# Patient Record
Sex: Female | Born: 1950 | Race: Black or African American | Hispanic: No | Marital: Single | Smoking: Never smoker
Health system: Southern US, Community
[De-identification: ages and names within clinical notes are randomized; demographics above are authoritative.]

## PROBLEM LIST (undated history)

## (undated) DIAGNOSIS — F259 Schizoaffective disorder, unspecified: Secondary | ICD-10-CM

## (undated) DIAGNOSIS — E785 Hyperlipidemia, unspecified: Secondary | ICD-10-CM

## (undated) DIAGNOSIS — E039 Hypothyroidism, unspecified: Secondary | ICD-10-CM

## (undated) DIAGNOSIS — Z720 Tobacco use: Secondary | ICD-10-CM

## (undated) HISTORY — DX: Hyperlipidemia, unspecified: E78.5

---

## 1997-07-22 ENCOUNTER — Ambulatory Visit (HOSPITAL_COMMUNITY): Admission: RE | Admit: 1997-07-22 | Discharge: 1997-07-22 | Payer: Self-pay | Admitting: Endocrinology

## 1997-08-30 ENCOUNTER — Ambulatory Visit (HOSPITAL_COMMUNITY): Admission: RE | Admit: 1997-08-30 | Discharge: 1997-08-30 | Payer: Self-pay | Admitting: Endocrinology

## 1997-10-04 ENCOUNTER — Ambulatory Visit (HOSPITAL_COMMUNITY): Admission: RE | Admit: 1997-10-04 | Discharge: 1997-10-04 | Payer: Self-pay | Admitting: Family Medicine

## 1997-10-18 ENCOUNTER — Ambulatory Visit (HOSPITAL_COMMUNITY): Admission: RE | Admit: 1997-10-18 | Discharge: 1997-10-18 | Payer: Self-pay | Admitting: Family Medicine

## 1997-10-21 ENCOUNTER — Other Ambulatory Visit: Admission: RE | Admit: 1997-10-21 | Discharge: 1997-10-21 | Payer: Self-pay | Admitting: Family Medicine

## 1997-11-20 ENCOUNTER — Emergency Department (HOSPITAL_COMMUNITY): Admission: EM | Admit: 1997-11-20 | Discharge: 1997-11-20 | Payer: Self-pay | Admitting: Emergency Medicine

## 1997-11-21 ENCOUNTER — Inpatient Hospital Stay (HOSPITAL_COMMUNITY): Admission: RE | Admit: 1997-11-21 | Discharge: 1997-12-02 | Payer: Self-pay | Admitting: *Deleted

## 1998-05-13 ENCOUNTER — Ambulatory Visit (HOSPITAL_COMMUNITY): Admission: RE | Admit: 1998-05-13 | Discharge: 1998-05-13 | Payer: Self-pay | Admitting: Family Medicine

## 1998-10-03 ENCOUNTER — Encounter: Payer: Self-pay | Admitting: Emergency Medicine

## 1998-10-03 ENCOUNTER — Emergency Department (HOSPITAL_COMMUNITY): Admission: EM | Admit: 1998-10-03 | Discharge: 1998-10-03 | Payer: Self-pay | Admitting: Emergency Medicine

## 1999-05-13 ENCOUNTER — Encounter: Payer: Self-pay | Admitting: Emergency Medicine

## 1999-05-13 ENCOUNTER — Ambulatory Visit (HOSPITAL_COMMUNITY): Admission: RE | Admit: 1999-05-13 | Discharge: 1999-05-13 | Payer: Self-pay | Admitting: Emergency Medicine

## 2000-05-16 ENCOUNTER — Ambulatory Visit (HOSPITAL_COMMUNITY): Admission: RE | Admit: 2000-05-16 | Discharge: 2000-05-16 | Payer: Self-pay | Admitting: Family Medicine

## 2000-05-16 ENCOUNTER — Encounter: Payer: Self-pay | Admitting: Family Medicine

## 2000-12-23 ENCOUNTER — Inpatient Hospital Stay (HOSPITAL_COMMUNITY): Admission: EM | Admit: 2000-12-23 | Discharge: 2001-01-11 | Payer: Self-pay | Admitting: Psychiatry

## 2001-05-18 ENCOUNTER — Ambulatory Visit (HOSPITAL_COMMUNITY): Admission: RE | Admit: 2001-05-18 | Discharge: 2001-05-18 | Payer: Self-pay | Admitting: Family Medicine

## 2001-05-18 ENCOUNTER — Encounter: Payer: Self-pay | Admitting: Family Medicine

## 2001-06-15 ENCOUNTER — Inpatient Hospital Stay (HOSPITAL_COMMUNITY): Admission: EM | Admit: 2001-06-15 | Discharge: 2001-07-07 | Payer: Self-pay | Admitting: *Deleted

## 2002-02-27 ENCOUNTER — Inpatient Hospital Stay (HOSPITAL_COMMUNITY): Admission: EM | Admit: 2002-02-27 | Discharge: 2002-03-09 | Payer: Self-pay | Admitting: Psychiatry

## 2002-03-08 ENCOUNTER — Emergency Department (HOSPITAL_COMMUNITY): Admission: EM | Admit: 2002-03-08 | Discharge: 2002-03-08 | Payer: Self-pay | Admitting: Emergency Medicine

## 2002-05-21 ENCOUNTER — Ambulatory Visit (HOSPITAL_COMMUNITY): Admission: RE | Admit: 2002-05-21 | Discharge: 2002-05-21 | Payer: Self-pay | Admitting: Family Medicine

## 2002-05-21 ENCOUNTER — Encounter: Payer: Self-pay | Admitting: Family Medicine

## 2003-05-23 ENCOUNTER — Ambulatory Visit (HOSPITAL_COMMUNITY): Admission: RE | Admit: 2003-05-23 | Discharge: 2003-05-23 | Payer: Self-pay | Admitting: Family Medicine

## 2004-05-25 ENCOUNTER — Ambulatory Visit (HOSPITAL_COMMUNITY): Admission: RE | Admit: 2004-05-25 | Discharge: 2004-05-25 | Payer: Self-pay | Admitting: Family Medicine

## 2005-05-27 ENCOUNTER — Encounter: Admission: RE | Admit: 2005-05-27 | Discharge: 2005-05-27 | Payer: Self-pay | Admitting: Family Medicine

## 2006-02-11 ENCOUNTER — Inpatient Hospital Stay (HOSPITAL_COMMUNITY): Admission: EM | Admit: 2006-02-11 | Discharge: 2006-02-13 | Payer: Self-pay | Admitting: Emergency Medicine

## 2006-02-13 ENCOUNTER — Ambulatory Visit: Payer: Self-pay | Admitting: *Deleted

## 2006-02-13 ENCOUNTER — Inpatient Hospital Stay (HOSPITAL_COMMUNITY): Admission: EM | Admit: 2006-02-13 | Discharge: 2006-02-21 | Payer: Self-pay | Admitting: *Deleted

## 2006-06-17 ENCOUNTER — Ambulatory Visit (HOSPITAL_COMMUNITY): Admission: RE | Admit: 2006-06-17 | Discharge: 2006-06-17 | Payer: Self-pay | Admitting: Family Medicine

## 2006-08-29 ENCOUNTER — Encounter: Admission: RE | Admit: 2006-08-29 | Discharge: 2006-08-29 | Payer: Self-pay | Admitting: Family Medicine

## 2006-11-24 ENCOUNTER — Encounter: Admission: RE | Admit: 2006-11-24 | Discharge: 2006-11-24 | Payer: Self-pay | Admitting: Family Medicine

## 2007-07-26 ENCOUNTER — Ambulatory Visit (HOSPITAL_COMMUNITY): Admission: RE | Admit: 2007-07-26 | Discharge: 2007-07-26 | Payer: Self-pay | Admitting: Family Medicine

## 2008-07-26 ENCOUNTER — Ambulatory Visit (HOSPITAL_COMMUNITY): Admission: RE | Admit: 2008-07-26 | Discharge: 2008-07-26 | Payer: Self-pay | Admitting: Family Medicine

## 2009-01-24 ENCOUNTER — Encounter: Admission: RE | Admit: 2009-01-24 | Discharge: 2009-01-24 | Payer: Self-pay | Admitting: Family Medicine

## 2009-04-24 ENCOUNTER — Observation Stay (HOSPITAL_COMMUNITY): Admission: AD | Admit: 2009-04-24 | Discharge: 2009-04-25 | Payer: Self-pay | Admitting: Internal Medicine

## 2009-04-24 ENCOUNTER — Other Ambulatory Visit: Admission: RE | Admit: 2009-04-24 | Discharge: 2009-04-24 | Payer: Self-pay | Admitting: Family Medicine

## 2009-04-24 ENCOUNTER — Encounter (INDEPENDENT_AMBULATORY_CARE_PROVIDER_SITE_OTHER): Payer: Self-pay | Admitting: Internal Medicine

## 2009-07-28 ENCOUNTER — Ambulatory Visit (HOSPITAL_COMMUNITY): Admission: RE | Admit: 2009-07-28 | Discharge: 2009-07-28 | Payer: Self-pay | Admitting: Family Medicine

## 2010-05-03 LAB — URINALYSIS, MICROSCOPIC ONLY
Glucose, UA: NEGATIVE mg/dL
Specific Gravity, Urine: 1.006 (ref 1.005–1.030)
Urobilinogen, UA: 1 mg/dL (ref 0.0–1.0)
pH: 5.5 (ref 5.0–8.0)

## 2010-05-03 LAB — COMPREHENSIVE METABOLIC PANEL
ALT: 16 U/L (ref 0–35)
Alkaline Phosphatase: 88 U/L (ref 39–117)
BUN: 5 mg/dL — ABNORMAL LOW (ref 6–23)
Calcium: 9.1 mg/dL (ref 8.4–10.5)
Chloride: 104 mEq/L (ref 96–112)
Creatinine, Ser: 0.95 mg/dL (ref 0.4–1.2)
GFR calc Af Amer: >60 mL/min (ref >60)
Glucose, Bld: 114 mg/dL — ABNORMAL HIGH (ref 70–99)
Potassium: 3.9 mEq/L (ref 3.5–5.1)
Sodium: 143 mEq/L (ref 135–145)
Total Bilirubin: 0.4 mg/dL (ref 0.3–1.2)

## 2010-05-03 LAB — CBC
Hemoglobin: 14.5 g/dL (ref 12.0–15.0)
MCHC: 32.9 g/dL (ref 30.0–36.0)
RBC: 5.14 MIL/uL — ABNORMAL HIGH (ref 3.87–5.11)
WBC: 10.5 10*3/uL (ref 4.0–10.5)

## 2010-05-03 LAB — SEDIMENTATION RATE: Sed Rate: 9 mm/hr (ref 0–22)

## 2010-05-03 LAB — URINE CULTURE
Colony Count: NO GROWTH
Culture: NO GROWTH

## 2010-05-03 LAB — DIFFERENTIAL
Basophils Absolute: 0 10*3/uL (ref 0.0–0.1)
Basophils Relative: 0 % (ref 0–1)
Eosinophils Absolute: 0.1 10*3/uL (ref 0.0–0.7)
Lymphs Abs: 3.5 10*3/uL (ref 0.7–4.0)

## 2010-05-03 LAB — PREALBUMIN: Prealbumin: 17.7 mg/dL — ABNORMAL LOW (ref 18.0–45.0)

## 2010-05-03 LAB — TSH: TSH: 1.828 u[IU]/mL (ref 0.350–4.500)

## 2010-06-26 NOTE — H&P (Signed)
[facility]  Patient:    MEHAR, SAGEN Visit Number: [id number] MRN: [id number]          Service Type: PSY Location: [id number] [id number] Attending Physician:  Jeanice Lim Dictated by:   Jeanice Lim, M.D. Admit Date:  12/23/2000 Discharge Date: 01/11/2001                           History and Physical  INCOMPLETE Dictated by:   Jeanice Lim, M.D. Attending Physician:  Jeanice Lim DD:  02/23/01 TD:  02/23/01 Job: [id number]

## 2010-06-26 NOTE — Discharge Summary (Signed)
[facility]  Patient:    [name] Day, [name] Day Visit Number: [id number] MRN: [id number]          Service Type: PSY Location: 400 0405 02 Attending Physician:  Jeanice Lim Dictated by:   Reymundo Poll Dub Mikes, M.D. Admit Date:  12/23/2000 Discharge Date: 01/11/2001                             Discharge Summary  CHIEF COMPLAINT AND PRESENTING ILLNESS:  This was the first admission to [facility] for this 59 year old female, voluntary admitted. History of schizoaffective disorder, referred for medication noncompliance for several months.  Not clear how long she has been with no medication.  Her son brought her into the hospital and reported that she had some increased disorganization, not attending to her ADLs or sleeping properly.  The patient on evaluation was withdrawn, psychomotor retardation, little information, poor eye contact.  She quit taking her medication a long time ago.  She has not been to mental health in many months.  "Something is broken inside of me." Unable to elaborate.  Denies any auditory or visual hallucinations, denies any suicidal feelings.  Does admit to feeling confused and unable to think straight, feeling sad.  She has no desire to eat or drink.  PAST PSYCHIATRIC HISTORY:  Followed at [facility]. They have reported that she has not been seen since April 2002.  History of schizoaffective disorder.  Unclear the history of hospitalizations.  SUBSTANCE ABUSE HISTORY:  Denies the use or abuse of any substances.  MEDICAL HISTORY:  Includes hypothyroidism.  MEDICATIONS:  Was on at one time Cogentin 1 mg twice a day and Prolixin 25 every 2 weeks.  She was also on Remeron.  PHYSICAL EXAMINATION:  Performed, failed to show any acute findings.  MENTAL STATUS EXAMINATION:  Reveals an alert female with considerable exophthalmos, dressed in a gown, blunted affect, poor eye contact,  withdrawn, psychomotor retardation, significant thought blocking.  Speech is soft in tone, slow pace, significant latency.  Mood depressed, affect depressed. Admits to suicidal ideas, no specific intent or plan.  Promised safety in the unit.  Refusing food and fluid, disorganized thoughts, thought blocking, "something broken inside."  ADMITTING  DIAGNOSES: Axis I:    Schizoaffective disorder. Axis II:   Deferred. Axis III:  Hypothyroidism, anorexia. Axis IV:   Moderate. Axis V:    Global assessment of function upon admission 25, highest            global assessment of function in past year 60-65.  COURSE IN THE HOSPITAL:  She was admitted and started on intensive individual and group psychotherapy.  Laboratory workup:  Sodium 147, potassium 3.2 that was replaced and went up to 4.1.  AST 63, ALT 101, total bilirubin 1.1.  TSH 6.62.  She was initially very resistant to eat or to drink.  She required a lot of encouragement and close monitoring to be sure that she was at least taking some nutrients.  She was given Prolixin 2 mg in the morning and 5 mg at bedtime.  She was given Remeron 15 at bedtime.  She was given Prolixin D 25 mg on November 15, and the Prolixin was increased to 10 mg at bedtime.  She was started on Lexapro 10 mg due to persistent depression.  We went ahead and started Zyprexa as she was not responding readily to the Prolixin and she was  not drinking or eating.  Gradually, we went down on the Prolixin by mouth and started going up on the Zyprexa to 10 and then 15 mg.  Prolixin oral was discontinued and Zyprexa was increased to 20.  Lexapro was increased to 15 and 20 and Zyprexa was increased to 20 mg.  Remeron was increased to 30.  Slowly, she started coming around and she did admit that she heard the voice of God that told her that was not supposed to eat.  Eventually, she denied hearing the voice anymore.  She started coming out of the room and she started eating. The  process was pretty slow but the improvement was consistent.  On December 4, she was much improved.  She was eating, she was drinking, she was sleeping. No acute psychosis, no suicidal ideas, no homicidal ideas.  We encouraged and made a point of making sure that she understood the need to stay on the medications and to follow up with mental health.  We went ahead and discharged her to outpatient followup.  DISCHARGE  DIAGNOSES: Axis I:    Schizoaffective disorder. Axis II:   No diagnosis. Axis III:  Hypothyroidism. Axis IV:   Moderate. Axis V:    Global assessment of function upon discharge 55.  DISCHARGE MEDICATIONS: 1. Synthroid 50 mcg every day. 2. Protonix 40 mg daily. 3. Zyprexa 20 mg at bedtime. 4. Lexapro 20 mg at bedtime. 5. Remeron 30 at bedtime. 6. Ambien 10 at bedtime as needed for sleep.  DISPOSITION:  To be followed up by [facility]. Dictated by:   Reymundo Poll Dub Mikes, M.D. Attending Physician:  Jeanice Lim DD:  03/15/01 TD:  03/16/01 Job: [id number]

## 2010-06-26 NOTE — Discharge Summary (Signed)
NAME:  [name] Day, [name] Day                        ACCOUNT NO.:  [id number]   MEDICAL RECORD NO.:  [id number]                   PATIENT TYPE:  IPS   LOCATION:  0403                                 [facility]   PHYSICIAN:  Jeanice Lim, M.D.              DATE OF BIRTH:  [birth year]   DATE OF ADMISSION:  02/27/2002  DATE OF DISCHARGE:  03/09/2002                                 DISCHARGE SUMMARY   IDENTIFYING DATA:  This is a 60 year old African-American female voluntarily  admitted.  Third admission to [facility].  Reading the Bible,  hyperreligious, stopped eating, feeling that she is protected and did not  need to eat.  Questionable compliance with medications.  Followed by Dr.  Hortencia Pilar as an outpatient.   MEDICATIONS:  Levothyroxine, Zyprexa, Lexapro and trazodone.   ALLERGIES:  No known drug allergies.   PHYSICAL EXAMINATION:  Essentially within normal limits.  Neurologically  nonfocal.   LABORATORY DATA:  Routine admission labs essentially within normal limits.   MENTAL STATUS EXAM:  Petite African-American female fully alert, edentulous.  Speech soft.  Mood apathetic, guarded.  Affect restricted.  Thought  processes goal directed with some thought-blocking.  Thought content  positive for delusional thoughts and hyperreligious preoccupation.  Cognitively intact.  Judgment and insight poor.   ADMISSION DIAGNOSES:   AXIS I:  Schizoaffective disorder, depressed-phase.   AXIS II:  None.   AXIS III:  1. Hypothyroidism.  2. Elevated liver function test.  3. Anorexia.   AXIS IV:  Moderate stressors (limited support system).   AXIS V:  25/59.   HOSPITAL COURSE:  The patient was admitted and ordered routine p.r.n.  medications and underwent further monitoring.  Was encouraged to participate  in individual, group and milieu therapy.  The patient underwent medical  monitoring and was optimized on psychotropics and resumed on medical  medications.   Zyprexa was optimized and Lexapro adjusted targeting  depressive component.  Family was involved in aftercare planning and felt  comfortable with monitoring patient's self-care and compliance with  medications.  The patient gradually showed a positive response and no  significant side effects.   CONDITION ON DISCHARGE:  Markedly improved.  Mood was more euthymic.  Affect  brighter.  Thought processes goal directed.  Thought content negative for  dangerous ideation or psychotic symptoms.  The patient reported motivation  to be compliant with medications and was negative for delusions or suicidal  or homicidal ideation at the time of discharge.   DISCHARGE MEDICATIONS:  1. Tobrex 0.3% ophthalmic solution.  2. Levothyroxine 50 mcg q.a.m.  3. Pepcid 20 mg b.i.d.  4. Lexapro 20 mg at 6 p.m.  5. Ambien 10 mg q.h.s.  6. Trazodone 50 mg q.h.s.  7. Zyprexa 20 mg q.h.s.   FOLLOW UP:  [facility] on March 14, 2002 at 2:30  p.m.   DISCHARGE DIAGNOSES:   AXIS  I:  Schizoaffective disorder, depressed-phase.   AXIS II:  None.   AXIS III:  1. Hypothyroidism.  2. Elevated liver function test.  3. Anorexia.   AXIS IV:  Moderate stressors (limited support system).   AXIS V:  Global Assessment of Functioning on discharge 50-55.                                               Jeanice Lim, M.D.    JEM/MEDQ  D:  04/04/2002  T:  04/04/2002  Job:  [id number]

## 2010-06-26 NOTE — Discharge Summary (Signed)
[name] Day, [name]              ACCOUNT NO.:  [id number]   MEDICAL RECORD NO.:  [id number]          PATIENT TYPE:  INP   LOCATION:  5705                         FACILITY:  [facility]   PHYSICIAN:  Barnetta Chapel, MDDATE OF BIRTH:  1950/02/25   DATE OF ADMISSION:  02/10/2006  DATE OF DISCHARGE:  02/13/2006                               DISCHARGE SUMMARY   Patient was discharged to a psychiatric unit on February 13, 2006.   ADMITTING DIAGNOSES:  1. Poor oral intake.  2. Failure to thrive.  3. Leukocytosis.  4. Hypothyroidism.  5. Schizoaffective disorder.   DISCHARGE DIAGNOSES:  1. Acute psychotic disorder.  2. History of schizoaffective disorder.  3. Poor oral intake.  4. Leukocytosis.  5. History of hypothyroidism.   CONSULTS DONE:  Psychiatric consult.  The patient was felt to be  experiencing acute psychosis.  The patient was eventually transferred to  [facility].   BRIEF HISTORY AND HOSPITAL COURSE:  Please refer to the H&P done on  February 10, 2006.  The patient is a 60 year old female with past medical  history significant for hypothyroidism, depression,schizoaffective  disorder and aneurysm.  The patient was admitted with poor p.o. intake.  She could not give a good history.  Patient's behavior was felt to be  unusual for the patient.  The patient was initially admitted to the  medical ward.  Leukocytosis was noted on admission, but no clear source  of infection.  The tachycardia was observed during her stay in the  hospital.  The patient's clinical presentation was largely psychiatric.  Psychiatry consult was called and the patient was seen by Dr. Jeanie Sewer,  who advised inpatient psychiatry care.  The patient was eventually  transferred to a psychiatric unit on February 13, 2006.  The patient's  tachycardia was controlled with Toprol-XL.  The acute psychosis was  managed with Zyprexa and Ativan p.r.n. (as per psych advise).   DISCHARGE PLAN:  1. Transfer  patient to psychiatry unit.  2. Continue current medications.     Barnetta Chapel, MD  Electronically Signed    SIO/MEDQ  D:  05/07/2006  T:  05/07/2006  Job:  [id number]

## 2010-06-26 NOTE — Discharge Summary (Signed)
[facility]  Patient:    [name] Day Visit Number: [id number] MRN: [id number]          Service Type: PSY Location: 400 0405 02 Attending Physician:  Jeanice Lim Dictated by:   Reymundo Poll Dub Mikes, M.D. Admit Date:  06/15/2001 Discharge Date: 07/07/2001                             Discharge Summary  CHIEF COMPLAINT AND HISTORY OF PRESENT ILLNESS:  This was one of several admissions to [facility] for this 60 year old female who presented to the emergency room, brought there by her son.  She had been withdrawn, listless, failing to take her medications for two months prior to this admission, not attending to her activities of daily living.  History of schizoaffective disorder, repeated medication noncompliance.  Refusing medications, refusing food, refusing fluids.  Did admit that the voices were telling her that "the babies have problems."  Did admit that the voices were telling her to refuse food and fluids.  PAST PSYCHIATRIC HISTORY:  [facility].  Multiple admissions to [facility].  Last had been on Prolixin and Zyprexa with good results.  Before, she was on Lexapro for depression. Mainly, she is noncompliant.  SUBSTANCE ABUSE HISTORY:  There is no evidence of abuse of any substances.  PAST MEDICAL HISTORY:  Positive for hypothyroidism.  MEDICATIONS ON ADMISSION: 1. Lexapro 20 mg per Day. 2. Remeron 30 mg at bedtime. 3. Zyprexa 20 mg at bedtime. 4. Synthroid 50 mcg daily, although she was not taking.  MENTAL STATUS EXAMINATION ON ADMISSION:  Well-nourished, well-developed, female in bed, eyes closed.  Wore a wig that was initially pulled down close over her eyes to hide her eyes.  Very poor eye contact, very reserved, very guarded, no agitation.  Nonspontaneous content although her speech was normal in tone and pace.  Thought processes: Disorganized.  Mood:  Depressed.  Affect: Constricted.  Seemed to be responding to internal stimuli.  ADMITTING DIAGNOSES: Axis I:    Schizoaffective disorder, depressed. Axis II:   No diagnosis. Axis III:  Hypothyroidism. Axis IV:   Moderate. Axis V:    Global assessment of functioning upon admission 24, highest global            assessment of functioning in the last year 66.  LABORATORY DATA:  Within normal limits.  HOSPITAL COURSE:  She was admitted and started in intensive individual and group psychotherapy.  Worked on improving her reality testing.  She was placed back on her medications.  She was given Zyprexa that was increased as tolerated and she was placed back on Lexapro up to 10 mg per Day.  Slowly she started responding to the medication.  She became more alert, more cooperative, less guarded, was able to start eating and eventually was able to go out to the cafeteria.  Her mood improved, her affect became brighter, not endorsing any further auditory hallucinations, objectively she was not responding to any internal stimuli.  By May 30, it was felt she had obtained full benefit from the hospitalization, mood was improved, affect was bright, she was in remission of the psychotic symptoms, she was eating and drinking, and she claimed that this time around she was going to keep her medications.  DISCHARGE DIAGNOSES: Axis I:    Schizoaffective disorder, depressed. Axis II:   No diagnosis. Axis III:  Hypothyroidism.  Axis IV:   Moderate. Axis V:    Global assessment of functioning upon discharge 55.  DISCHARGE MEDICATIONS: 1. Zyprexa 20 mg at bedtime. 2. Synthroid 50 mcg daily. 3. Lexapro 10 mg daily. 4. Trazodone as needed for sleep.  FOLLOWUP:  [facility]. Dictated by:   Reymundo Poll Dub Mikes, M.D. Attending Physician:  Jeanice Lim DD:  08/16/01 TD:  08/16/01 Job: [id number]

## 2010-06-26 NOTE — H&P (Signed)
NAMEALVEDA, VANHORNE              ACCOUNT NO.:  [id number]   MEDICAL RECORD NO.:  [id number]          PATIENT TYPE:  INP   LOCATION:  1826                         FACILITY:  [facility]   PHYSICIAN:  Hollice Espy, M.D.DATE OF BIRTH:  [birth year]   DATE OF ADMISSION:  02/10/2006  DATE OF DISCHARGE:                              HISTORY & PHYSICAL   PRIMARY CARE PHYSICIAN:  She sees internal medicine at [facility],  although she cannot tell me the actual physician.   CHIEF COMPLAINT:  Decreased appetite.   HISTORY OF PRESENT ILLNESS:  The patient is a 60 year old African  American female with past medical history of hypothyroidism, depression  and schizoaffective disorder as well as anorexia who presents to the  emergency room, has been brought in by her family.  Currently, her  family is not with her and so I am getting the story second to third  hand through the ER attending physician, but apparently, the patient had  been having problems with weakness at home and after an initial  evaluation by the ER physician, he was unclear as to her full admitting  diagnosis.  However, the family discussed with the physician and said  that the patient had not been eating for weeks.  The patient was felt to  needed to be brought in for further evaluation.   The patient is a very, very poor historian.  She cannot tell me much  other than she denies any abdominal pain or any other kind of pain.  She  says her appetite has not been that great lately but she otherwise has  no complaints.  She denies any headaches or vision changes, trouble  swallowing, chest pain, palpitations, shortness of breath, wheezing,  coughing, abdominal pain, hematuria, dysuria, constipation, diarrhea,  focal extremity numbness, weakness or pain.  Review of systems otherwise  negative.  The patient's past medical history includes hypothyroidism,  anorexia, schizoaffective disorder.   MEDICATIONS:  The patient is on  Trazodone 50 p.o. q.h.s., Invega 6 mg  p.o. daily, Synthroid 50 mcg p.o. daily, Lexapro 20 p.o. daily.  She  reportedly is also on Seroquel, Lithium and Zoloft as is entered by the  nursing notes.  However, I do not see these bottles in the patient's bag  and I am unclear if these are additional medicines provided by the  family or not.  She has reportedly no known drug allergies.   SOCIAL HISTORY:  She denies any tobacco, alcohol or drug use.   FAMILY HISTORY:  Noncontributory.   PHYSICAL EXAMINATION:  The patient's vitals on admission temperature  97.2, heart rate 68, blood pressure 107/62, respirations 16, O2  saturation 97% on room air.  In general, the patient is alert and  oriented x2.  Her HEENT is normocephalic and atraumatic.  Her mucus  membranes are dry.  She has no carotid bruits.  Heart regular rate and  rhythm, S1-S2.  Lungs clear to auscultation bilaterally.  Abdomen is  soft, nontender, and nondistended.  Positive bowel sounds.  Extremities  with no clubbing or cyanosis and 1+ pitting edema.   LABORATORY DATA:  UA shows a moderate amount of hemoglobin and bilirubin  with 40 of ketones.  Urine drug screen is negative.  White count is 12.6  but with no shift.  H&H is 14.5 and 45, MCV of 84, platelet count of  272.  Sodium 141, potassium 4.2, chloride 113, bicarb 23, BUN 16,  creatinine 0.9, glucose 84.  TSH has been ordered and is pending.   ASSESSMENT/PLAN:  1. Decreased p.o. intake and failure to thrive:  This may be less of a      medical issue and more of a psychiatric issue.  In review of the      patient's old records, she has a [facility] back      in 2004 and one of the diagnoses was anorexia.  At this time, would      workup other etiologies including ruling out infection.  We will      see what her TSH is, continue her other medications and get a      physical therapy, occupational therapy and nutrition consult for      assistance.  We will  also check a liver function panel.  If these      workup is all negative, would then consider psychiatry consult.      This may be part of her disorder or depression.  2. Leukocytosis:  I do not think this is an infection given that there      is no shift but we will still check a portable chest x-ray as the      urinalysis was negative.  3. Hypothyroidism:  As mentioned, a thyroid-stimulating hormones has      been ordered and is pending.  4. Schizoaffective disorder:  We will confirm the rest of her      medications, specifically the Lithium, Zoloft but continue the      Trazodone, Invega and Lexapro.      Hollice Espy, M.D.  Electronically Signed     SKK/MEDQ  D:  02/11/2006  T:  02/11/2006  Job:  [id number]   cc:   Deboraha Sprang [facility] at [facility]
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
## 2010-06-26 NOTE — H&P (Signed)
[facility]  Patient:    [name] Day, [name] Day Visit Number: [id number] MRN: [id number]          Service Type: EMS Location: [facility] Attending Physician:  Armanda Heritage Dictated by:   Young Berry Scott, N.P. Admit Date:  12/22/2000 Discharge Date: 12/23/2000                     Psychiatric Admission Assessment  DATE OF ASSESSMENT:  December 23, 2000 at 2:35 p.m.  IDENTIFYING INFORMATION:  This is a 60 year old African-American female, who is a voluntary admission.  HISTORY OF PRESENT ILLNESS:  This patient, with a history of schizoaffective disorder, was referred by mental health for medication noncompliance for several months.  It is unclear how long patient has been without medications but appears she has not been to mental health since April of 2002.  Her son had brought her into the hospital and reported that she had become increasingly disorganized and not tending to her ADLs or sleeping properly. Today, the patient is withdrawn with psychomotor slowing and offers little information.  Her eye contact is poor.  She does say she just quit taking her medications "a long time ago" and has not been to mental health in many months.  She does state "something is broken inside of me" but she is unable to elaborate on this.  She denies any auditory or visual hallucinations at this time.  She denies any suicidal feelings when asked directly but does admit to feeling confused and unable to think straight and feeling sad.  The patient also admits that she has no desire to eat or drink anything.  PAST PSYCHIATRIC HISTORY:  The patient has been followed at [facility] but has not been seen there, according to their notes that they have forwarded, since April 2002.  The patient, according to their notes, has a history of schizoaffective disorder, no Axis II diagnosis and a history of past thyroid surgery for hyperthyroidism.  The patients history  of hospitalizations is unclear at this time.  SOCIAL HISTORY:  The patient reports that she lives alone in [place]. She relies on her aunt and uncle for transportation and shopping and other outside activities of daily living.  She has a son, who brought her into the hospital, and also has a daughter that she is in touch with.  She reports that she is on disability for many years and has not worked.  FAMILY HISTORY:  Unclear.  ALCOHOL/DRUG HISTORY:  The patients urine drug screen is negative.  MEDICAL HISTORY:  The patient is followed by Dr. Dorothe Pea at [facility].  Phone number [phone].  Medical problems include history of hyperthyroidism and patient reports that she was seen there three weeks ago for "a bad taste in my mouth."  MEDICATIONS:  The patients only medications, as far as we can confirm, was that she was on, at one point, Cogentin 1 mg b.i.d., Prolixin D 25 mg every two weeks injections and last injection was in April.  She was more recently placed on promethazine 25 mg q.4h. p.r.n. for nausea when she was seen by Dr. Dorothe Pea apparently three weeks ago.  She also has been treated with Celexa 20 mg in the past but is unable to describe how well she did on that and she was also treated with Remeron 15 mg q.h.s.  Again, it is noted that patient has not had any medications that we are aware  of for at least several weeks and probably several months.  When we checked with her pharmacy, she has not had any prescriptions filled in several months and has not had a Prolixin injection since April by our best estimate.  DRUG ALLERGIES:  None.  POSITIVE PHYSICAL FINDINGS:  The patient was seen in the [facility] Emergency Room and cleared.  She had no somatic complaints today.  Temperature 98.5, pulse 121, respirations 16, blood pressure 125/81.  She is 5 feet 1 inch tall, is of medium-build.  We do not have a weight on her at this time  but we will weigh her.  LABORATORY DATA:  Urine notes a large amount of bilirubin and many bacteria but no leukocyte esterase.  Her CBC was within normal limits.  We have not obtained a liver panel on her yet.  However, we have ordered a stat liver panel along with serum amylase and lipase and thyroid panel is also currently pending.  MENTAL STATUS EXAMINATION:  This is a fully alert patient, who has considerable exophthalmos.  She is dressed in a gown with a blunted affect with poor eye contact, generally withdrawn with psychomotor retardation and significant thought-blocking.  Her speech is soft in tone, somewhat slow in pace with significant latency.  Her mood is quite depressed.  She is positive for suicidal ideation but with no specific intent or plan.  She promises safety on the unit and mostly is just interested in going back to bed to sleep.  She has been refusing food and fluid.  I am able to get her to take 1-2 ounces of Ensure while we are speaking but that is about all.  She is mildly irritable with any further attempts to encourage her.  She does display considerably disorganized thoughts with considerable thought-blocking.  She looks at me and states that something is broken inside of her but she is unable to describe it any further and she does admit that she is confused. She does appear to have some mild paranoia stating that she is fearful that others are going to break something else in her.  Cognitively, she is intact for person and place.  She does not know the day, the date or is not able to explain her circumstances.  DIAGNOSES: Axis I:    Rule out schizoaffective disorder. Axis II:   Deferred. Axis III:  1. Elevated urobilinogen of unknown origin.            2. Hyperthyroidism by history.            3. Anorexia. Axis IV:   Deferred. Axis V:    Current 24; past year 58 estimated.  PLAN:  Voluntarily admit the patient to stabilize her mood and improve  her reality testing with a goal of improving her activities of daily living, her overall functioning, improving her food and fluid intake and alleviating her  neurovegetative symptoms and her suicidal ideation.  Our long-term goal is to be able to get her back to fully independent living.  We will monitor her food and fluid intake.  We have a thyroid panel pending.  We will weigh her and get a stat liver panel.  Meanwhile, we are waiting for Dr. Jonette Mate office at [facility] to respond regarding her medications.  Meanwhile, we initially started her on Prolixin 2 mg in the morning and 5 mg at h.s.  We will now increase her h.s. dose to 10 mg and start her on injections of Prolixin D  25 mg at h.s.  We are waiting to decide about a Synthroid dose.  However, her M.D. confirmed that she has not been on Synthroid and again the thyroid panel is pending.  We may ask [facility] as to consult but we will make that decision when the labs are returned and lastly we will place her on Remeron 15 mg p.o. q.h.s. and start her in individual and group psychotherapy.  ESTIMATED LENGTH OF STAY:  Five to seven days. Dictated by:   Young Berry Scott, N.P. Attending Physician:  Armanda Heritage DD:  12/23/00 TD:  12/23/00 Job: [id number]

## 2010-06-26 NOTE — Discharge Summary (Signed)
NAMEDEKISHA, MESMER              ACCOUNT NO.:  [id number]   MEDICAL RECORD NO.:  [id number]          PATIENT TYPE:  IPS   LOCATION:  0403                          FACILITY:  [facility]   PHYSICIAN:  Jasmine Pang, M.D. DATE OF BIRTH:  09-May-1950   DATE OF ADMISSION:  02/13/2006  DATE OF DISCHARGE:  02/21/2006                               DISCHARGE SUMMARY   IDENTIFICATION:  A 60 year old single African American female who was  admitted on a voluntary basis on February 13, 2006.   HISTORY OF PRESENT ILLNESS:  The patient has had a history of  depression.  She has been decompensating and unable to care for herself.  Her son came by her home and took her to the ED because he was so  concerned.  The patient states she did not want to get out of bed.  She  has not been eating.  She had positive auditory hallucinations she has  been unable to describe.  She has been feeling depressed.  No suicidal  ideation.  Denies specific stressors.  Noncompliant with medication.  The patient was here in 2004.  She goes to [facility].  The patient has hypothyroidism.  She denies drug or alcohol  use.   MEDICINES:  Lexapro 20, levothyroxine 50, Megace 400 b.i.d., metoprolol  25 mg daily, Zyprexa 5 mg p.o. b.i.d.   She has no known allergies.   PHYSICAL AND LABORATORY FINDINGS:  The patient was assessed at the [facility] ED.  She was in no acute distress with no medical problems.  Her  hemoglobin was 17, hematocrit was 50, glucose was elevated at 113.  UDS  was negative.  Chest x-ray negative.  TSH normal at 3.562.  The  urinalysis was positive for ketones.   HOSPITAL COURSE:  Upon admission, the patient was placed on Synthroid 50  mcg p.o. daily, Lexapro 20 mg daily, Megace 400 mg p.o. b.i.d., Zyprexa  5 mg p.o. b.i.d., metoprolol XL 25 mg p.o. daily, Ativan 1 to 2 mg p.o.  q. 4h p.r.n. anxiety, Desyrel 50 mg p.o. q.h.s.  On February 14, 2005, the  patient was given Ensure  t.i.d. between meals and p.r.n. as per and her  nutrition protocol.  On February 15, 2005 the patient's Zyprexa was  increased to 5 mg q.a.m. and 10 mg q.h.s.  On February 19, 2006, Zyprexa  during the day was discontinued and she was just kept on Zyprexa 10 mg  p.o. q.h.s.  The patient tolerated her medications well with no  significant side effects.   Upon first meeting the patient on February 14, 2006, she stated she had  not been wanting to see out of bed.  She was unable to describe her  auditory hallucinations but admitted she had been having some.  She also  states she was not eating.  She seemed confused and disorganized, had  difficulty conversing with me.  Poor eye contact and psychomotor  retardation.  On February 15, 2006, the patient continued to talk about  her inability to care for herself prior to admission.  There was no  significant change in mental status.  She stated her son was worried  about her prior to admission.  On February 16, 2005, the patient continued  to have significant psychomotor retardation.  Speech nonspontaneous,  somewhat slurred.  Mood dysphoric.  Affect constricted.  She states she  did not sleep well.  Her appetite is poor.  She was fairly vegetative.  It was unclear whether she was responding to internal stimuli.  On  February 17, 2006, there was no significant change in mental status,  though her eye contact was somewhat improved.  On February 18, 2006, the  patient was slightly more interactive, still complaining of poor sleep  at night but also stays in bed all day which may contribute to insomnia.  Appetite is poor.  She described not being sure whether she wants to  live or not.  On February 19, 2006, the patient's mental status had  improved somewhat.  She denied suicidal ideation.  On February 20, 2005,  the patient still reported not sleeping well and some lack of energy.  She denies psychosis.  Denied suicidal ideation.   On February 21, 2006, mental  status had improved.  The patient was  friendlier, somewhat more talkative.  She had good eye contact.  Speech  was still somewhat soft and slow but improved.  There was psychomotor  retardation but less so than upon admission.  Mood euthymic.  Affect:  Still constricted but able to smile.  No suicidal or homicidal ideation.  No thoughts of self-injurious behavior.  No auditory or visual  hallucinations.  No paranoid delusions.  Thoughts logical and goal-  directed.  Thought content:  No predominant theme.  Cognitive was  grossly within normal limits.  It was felt safe for the patient to be  discharged today.   DISCHARGE DIAGNOSES:  AXIS I:  Schizoaffective disorder.  AXIS II:  None.  AXIS III:  Hypothyroidism.  AXIS IV:  Severe (problems with primary support group, other  psychosocial problems-burden of illness, medical problems).  AXIS V:  Global Assessment of Functioning upon discharge 48.  Global  Assessment of Functioning upon admission 25.  Global Assessment of  Functioning:  Highest past year was 55.   DISCHARGE PLANS:  There were no specific activity level or dietary  restrictions.   DISCHARGE MEDICATIONS:  Levothyroxine 50 mcg daily, Lexapro 20 mg daily,  Megace 400 mg 1 pill twice daily, Toprol XL 25 mg daily, Zyprexa 10 mg  at bedtime.   POST HOSPITAL CARE PLANS:  The patient will be seen at the [facility] by Dr. Gwyndolyn Kaufman on Wednesday February 23, 2006 at 1:30 p.m.      Jasmine Pang, M.D.  Electronically Signed     BHS/MEDQ  D:  02/21/2006  T:  02/22/2006  Job:  [id number]

## 2010-06-26 NOTE — Consult Note (Signed)
NAMEEUNA, ARMON              ACCOUNT NO.:  [id number]   MEDICAL RECORD NO.:  [id number]          PATIENT TYPE:  INP   LOCATION:  5705                         FACILITY:  [facility]   PHYSICIAN:  Antonietta Breach, M.D.  DATE OF BIRTH:  1950-03-27   DATE OF CONSULTATION:  DATE OF DISCHARGE:  02/13/2006                                 CONSULTATION   REASON FOR CONSULTATION:  Psychosis and anorexia.   [name] Day is a 60 year old single female admitted to the [facility] on February 10, 2006.   She has not been eating well or drinking.  She has been noncompliant  with her outpatient medication.  She has thought blocking and  reclusiveness.  She has been hearing voices talking to each other.  Psychosocial efforts to help her come out of this state have not worked  and as mentioned, she has not been taking psychotropic medication  either.   PAST PSYCHIATRIC HISTORY:  [name] Day does have episodes of previous  psychosis.  In November of 2002, she was admitted to [facility] with medication noncompliance for several months.  She had  become disorganized in her thought process, was not able to do  activities of daily living.  She was displaying psychomotor slowing with  poverty of speech with poor eye contact.  She was stating that something  was broken inside of her.   She was stabilized on Zyprexa 20 mg at bedtime, Lexapro 20 mg q.a.m.,  and Remeron 30 mg at bedtime.   She was readmitted to the [facility] in May of 2003  and then an additional admission in January of 2004.  During the January  admission, she presented with hyper-religious thought content, anorexia,  and delusions.  She was stabilized with Lexapro and Zyprexa.   Other psychotropics that the patient has been prescribed in the past  include Seroquel, lithium, and Zoloft.   FAMILY PSYCHIATRIC HISTORY:  None known.   SOCIAL HISTORY:  [name] Day is from [place].   She has family  members who live locally and help her with transportation.  She has a  son and a daughter and 2 grandchildren.  Occupation:  Unemployed.  She  does not use alcohol or illegal drugs.  She is on medical disability.  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   GENERAL MEDICAL PROBLEMS:  Hypothyroidism and failure to thrive.   MEDICATIONS:  The MAR is reviewed.  The patient is on Synthroid 50 mcg  per day.  She is also on Invega 6 mg q. day, Lexapro 20 mg q. day.   ALLERGIES:  No known drug allergies.   LABORATORY DATA:  WBC 12.6, hemoglobin 14.5, platelet count 272.  Metabolic panel shows BUN 16, creatinine 0.9, TSH within normal limits.  Urine drug screen negative.   REVIEW OF SYSTEMS:  CONSTITUTIONAL:  Afebrile.  HEAD:  No trauma.  EYES:  No visual changes.  EARS:  No hearing impairment.  NOSE:  No rhinorrhea.  MOUTH/THROAT:  No sore throat.  NEUROLOGIC:  Unremarkable.  PSYCHIATRIC:  As above.  CARDIOVASCULAR:  No  chest pain, palpitations, or edema.  RESPIRATORY:  No coughing or wheezing.  GASTROINTESTINAL:  No nausea,  vomiting, or diarrhea.  GENITOURINARY:  No dysuria.  SKIN:  Unremarkable.  MUSCULOSKELETAL:  No deformities.  ENDOCRINE/METABOLIC:  As above.  HEMATOLOGIC/LYMPHATIC:  Unremarkable.   EXAMINATION:  VITAL SIGNS:  Temperature 99.4, pulse 105, respirations  20, blood pressure 131/63.   MENTAL STATUS EXAM:  [name] Day is a middle-aged female appearing her  stated age, reclining in a hospital bed in a supine position.  She has  poverty of speech.  Her affect is guarded.  Her mood is anxious.  Her  psychomotor tone is decreased.  Her eye contact is poor.  She is well  groomed.  She will not answer formal orientation questions or formal  memory questions.  Her thought process involves alogia, but overall  coherent and very limited due to poverty of speech.  Thought content  involves delusions.  She states that she is looking up into her head to  find a part of her head  that is lost and missing.  She hears voices  talking to each other.  She has thought blocking.  Throughout much of  the interview, she is only showing the whites of her eyes as she looks  up with her eyelids partially closed.  Her judgment is impaired for  taking care of herself outside the hospital; however, she is aware that  she has a mental condition that needs treatment.  She has developed some  insight into part of her mental symptoms involving the depression  component.  Her concentration is decreased.  Her fund of knowledge and  intelligence are below that of her estimated premorbid baseline.  On  speech, she does not have any dysarthria.  She does have normal  articulation.  However, there is poverty of speech and there is also a  flat prosody.   ASSESSMENT:  Axis I:  1. 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  2. Depressive disorder, not otherwise specified.  Axis II:  Deferred.  Axis III:  See general medical problems.  Axis IV:  Primary support group and general medical.  Axis V:  30.   RECOMMENDATIONS:  1. When medically cleared, would admit to a psychiatric hospital for      further evaluation and treatment.  2. Would discontinue Invega and start Zyprexa at 5 mg p.o. b.i.d. or      IM b.i.d.  The Zydis form can also be used for p.o. to facilitate      compliance.  3. Would continue trazodone 50 mg q.h.s. p.r.n. insomnia.  4. Would continue Lexapro 20 mg q. day for antidepression.  5. Low stimulation ego supportive psychotherapy.      Antonietta Breach, M.D.  Electronically Signed     JW/MEDQ  D:  02/14/2006  T:  02/14/2006  Job:  [id number]

## 2010-06-26 NOTE — H&P (Signed)
[facility]  Patient:    [name] Day, [name] Day Visit Number: [id number] MRN: [id number]          Service Type: PSY Location: [id number] [id number] Attending Physician:  Denny Peon Dictated by:   Young Berry Scott, R.N. N.P. Admit Date:  06/15/2001                     Psychiatric Admission Assessment  DATE OF ADMISSION:  Jun 15, 2001  DATE OF ASSESSMENT:  Jun 15, 2001, at 10:30 a.m.  PATIENT IDENTIFICATION:  This is a 60 year old African-American female who is a voluntary admission.  She is single.  HISTORY OF PRESENT ILLNESS:  This patient presented in the emergency room, brought there by her son because she had been withdrawn, listless, failing to take her medications for at least two months, and not attending to her activities of daily living.  She does have a history of schizoaffective disorder and repeated medication noncompliance according to her old records. So far, both in the emergency room and on the unit she has refused medications, food, and fluids, and has refused to allow her labs to be drawn. With some encouragement she does admit today, "The voices are telling me that the babies have problems."  She also does admit when questioned that the voices are telling her to refuse food and fluids.  Though she denies any suicidal ideation at this time, she does appear obviously depressed.  PAST PSYCHIATRIC HISTORY:  The patient is followed at [facility] and has been noncompliant with her appointments.  She does have a history of multiple prior admissions with her most recent one being at [facility] in November 2002.  At that time she presented with very similar symptoms and had been previously managed on Prolix in.  Her Prolixin was restarted but she had a poor response to it.  At that time the Prolixin was stopped and she was started on Zyprexa, which was gradually titrated upward to 20 mg p.o.  q.h.s.  She had a good response to the Zyprexa, was more responsive, up out of bed, participating in groups, but did have some persistent depression and Lexapro at that time was added to her medication regimen.  Since she has been discharged home, we are unclear of when her noncompliance began.  SUBSTANCE ABUSE HISTORY:  The patient denies.  PAST MEDICAL HISTORY:  The patient is followed in the past by Dr. Modena Jansky at [facility], according to her previous record.  Medical problems include a history of hypothyroidism and there is some question if she had had surgery in the past for hyperthyroidism but that is unconfirmed.  MEDICATIONS: 1. Lexapro 20 mg daily. 2. Remeron 30 mg q.h.s. 3. Zyprexa 20 mg q.h.s. 4. Synthroid 50 mcg daily.  It is noted that the patient is known to have been noncompliant with these medications for approximately two months, according to the son who took her to the emergency room last night.  DRUG ALLERGIES:  None according to her old record.  PHYSICAL EXAMINATION:  GENERAL:  The patients physical examination was done at [facility] and showed no acute findings and at that time she refused laboratory testing.  VITAL SIGNS:  On admission to the unit, temperature 97.0, pulse 69, respirations 24, blood pressure 101/70.  She is 5 feet 1 inch tall and had refused to be weighed although she is very small built.  SOCIAL HISTORY:  The patient lives alone in [place].  She has two children, a son and a daughter, and two grandchildren.  She is unemployed. She offers little today by way of additional history.  In the past she has relied on an aunt and other relatives for transportation and assistance.  FAMILY HISTORY:  Unclear.  MENTAL STATUS EXAMINATION:  The patient is in bed with her eyes closed.  She wears a wig which she has pulled down close over her eyes to hide her eyes. She has very poor eye contact with brief intermittent eye  contact.  She does not appear to be dehydrated at this time but she is emotionally withdrawn and generally resistant to the interview although she does offer short answers. She is calm without agitation.  Affect is blunted.  The patient also is edentulous, no teeth in.  Speech is normal in tone and pace although she does show considerable latency of response.  Mood is depressed and withdrawn. Thought process is disorganized.  She does admit to having auditory hallucinations.  She does appear to be internally distracted, admits that the voices are telling her not to eat or drink.  There is no evidence that she is in immediate threat to anyone else or to herself and she does deny suicidal ideation at this time.  She does have, obviously, some passive suicidal ideation but no active suicidal ideation; no evidence of homicidal ideation. Cognitive: Intact to person, place, and recent events leading up to her hospitalization.  ADMISSION DIAGNOSES: Axis I:    Schizoaffective disorder. Axis II:   Deferred. Axis III:  1. Hypothyroidism.            2. Anorexia. Axis IV:   Deferred at this time.  We will ask the case manager to evaluate            that. Axis V:    Current 24, past year 51.  INITIAL PLAN OF CARE:  Plan is to voluntarily admit the patient to evaluate her neurovegetative symptoms and to alleviate her auditory hallucinations with the goal of returning her to full functioning, eating, drinking, and able to participate in groups, and alleviating any suicidal ideation.  We will ask the case manager to assist [place] by contacting her family members and evaluate their concerns.  We will continue to offer her medications at this point and will consider forcing medications if that becomes necessary.  We will restart her on Zyprexa Zydis 10 mg q.a.m. and h.s., Lexapro 5 mg p.o. q.d., and will start her back on her Synthroid at 25 mcg daily and continue to encourage her to allow some laboratory  testing so we can gauge where she is at with the thyroid  medication.  We will monitor intake and output here for 48 hours.  ESTIMATED LENGTH OF STAY:  Eight days. Dictated by:   Young Berry Scott, R.N. N.P. Attending Physician:  Denny Peon DD:  06/15/01 TD:  06/15/01 Job: [id number]

## 2010-08-07 ENCOUNTER — Other Ambulatory Visit (HOSPITAL_COMMUNITY): Payer: Self-pay

## 2010-08-07 DIAGNOSIS — Z1231 Encounter for screening mammogram for malignant neoplasm of breast: Secondary | ICD-10-CM

## 2010-08-25 ENCOUNTER — Ambulatory Visit (HOSPITAL_COMMUNITY)
Admission: RE | Admit: 2010-08-25 | Discharge: 2010-08-25 | Disposition: A | Discharge status: Home / Self Care | Payer: Medicaid Other | Source: Ambulatory Visit | Attending: Family Medicine | Admitting: Family Medicine

## 2010-08-25 DIAGNOSIS — Z1231 Encounter for screening mammogram for malignant neoplasm of breast: Secondary | ICD-10-CM | POA: Insufficient documentation

## 2010-10-07 ENCOUNTER — Other Ambulatory Visit: Payer: Self-pay | Admitting: Neurological Surgery

## 2011-08-02 ENCOUNTER — Other Ambulatory Visit (HOSPITAL_COMMUNITY): Payer: Self-pay | Admitting: Family Medicine

## 2011-08-02 DIAGNOSIS — Z1231 Encounter for screening mammogram for malignant neoplasm of breast: Secondary | ICD-10-CM

## 2011-08-26 ENCOUNTER — Ambulatory Visit (HOSPITAL_COMMUNITY)
Admission: RE | Admit: 2011-08-26 | Discharge: 2011-08-26 | Disposition: A | Discharge status: Home / Self Care | Payer: Medicaid Other | Source: Ambulatory Visit | Attending: Family Medicine | Admitting: Family Medicine

## 2011-08-26 DIAGNOSIS — Z1231 Encounter for screening mammogram for malignant neoplasm of breast: Secondary | ICD-10-CM | POA: Insufficient documentation

## 2012-05-04 ENCOUNTER — Other Ambulatory Visit (HOSPITAL_COMMUNITY)
Admission: RE | Admit: 2012-05-04 | Discharge: 2012-05-04 | Disposition: A | Discharge status: Home / Self Care | Payer: Medicaid Other | Source: Ambulatory Visit | Attending: Family Medicine | Admitting: Family Medicine

## 2012-05-04 ENCOUNTER — Other Ambulatory Visit: Payer: Self-pay | Admitting: Family Medicine

## 2012-05-04 DIAGNOSIS — Z124 Encounter for screening for malignant neoplasm of cervix: Secondary | ICD-10-CM | POA: Insufficient documentation

## 2012-05-04 DIAGNOSIS — Z1151 Encounter for screening for human papillomavirus (HPV): Secondary | ICD-10-CM | POA: Insufficient documentation

## 2012-08-03 ENCOUNTER — Other Ambulatory Visit (HOSPITAL_COMMUNITY): Payer: Self-pay | Admitting: Family Medicine

## 2012-08-03 DIAGNOSIS — Z1231 Encounter for screening mammogram for malignant neoplasm of breast: Secondary | ICD-10-CM

## 2012-08-30 ENCOUNTER — Ambulatory Visit (HOSPITAL_COMMUNITY)
Admission: RE | Admit: 2012-08-30 | Discharge: 2012-08-30 | Disposition: A | Discharge status: Home / Self Care | Payer: Medicaid Other | Source: Ambulatory Visit | Attending: Family Medicine | Admitting: Family Medicine

## 2012-08-30 DIAGNOSIS — Z1231 Encounter for screening mammogram for malignant neoplasm of breast: Secondary | ICD-10-CM | POA: Insufficient documentation

## 2013-07-24 ENCOUNTER — Other Ambulatory Visit (HOSPITAL_COMMUNITY): Payer: Self-pay | Admitting: Family Medicine

## 2013-07-24 DIAGNOSIS — Z1231 Encounter for screening mammogram for malignant neoplasm of breast: Secondary | ICD-10-CM

## 2013-09-04 ENCOUNTER — Ambulatory Visit (HOSPITAL_COMMUNITY): Payer: Medicaid Other

## 2013-09-20 ENCOUNTER — Ambulatory Visit (HOSPITAL_COMMUNITY)
Admission: RE | Admit: 2013-09-20 | Discharge: 2013-09-20 | Disposition: A | Discharge status: Home / Self Care | Payer: Medicaid Other | Source: Ambulatory Visit | Attending: Family Medicine | Admitting: Family Medicine

## 2013-09-20 DIAGNOSIS — Z1231 Encounter for screening mammogram for malignant neoplasm of breast: Secondary | ICD-10-CM | POA: Insufficient documentation

## 2014-08-16 ENCOUNTER — Other Ambulatory Visit (HOSPITAL_COMMUNITY): Payer: Self-pay | Admitting: Family Medicine

## 2014-08-16 ENCOUNTER — Other Ambulatory Visit: Payer: Self-pay | Admitting: Family Medicine

## 2014-08-16 DIAGNOSIS — Z720 Tobacco use: Secondary | ICD-10-CM

## 2014-08-16 DIAGNOSIS — Z1231 Encounter for screening mammogram for malignant neoplasm of breast: Secondary | ICD-10-CM

## 2014-09-23 ENCOUNTER — Ambulatory Visit (HOSPITAL_COMMUNITY)
Admission: RE | Admit: 2014-09-23 | Discharge: 2014-09-23 | Disposition: A | Discharge status: Home / Self Care | Payer: Medicaid Other | Source: Ambulatory Visit | Attending: Family Medicine | Admitting: Family Medicine

## 2014-09-23 DIAGNOSIS — Z1231 Encounter for screening mammogram for malignant neoplasm of breast: Secondary | ICD-10-CM | POA: Diagnosis present

## 2015-02-17 DIAGNOSIS — M25471 Effusion, right ankle: Secondary | ICD-10-CM | POA: Diagnosis not present

## 2015-02-17 DIAGNOSIS — Z23 Encounter for immunization: Secondary | ICD-10-CM | POA: Diagnosis not present

## 2015-02-21 ENCOUNTER — Other Ambulatory Visit: Payer: Self-pay | Admitting: Family Medicine

## 2015-02-21 ENCOUNTER — Ambulatory Visit
Admission: RE | Admit: 2015-02-21 | Discharge: 2015-02-21 | Disposition: A | Discharge status: Home / Self Care | Payer: Medicaid Other | Source: Ambulatory Visit | Attending: Family Medicine | Admitting: Family Medicine

## 2015-02-21 DIAGNOSIS — M25471 Effusion, right ankle: Secondary | ICD-10-CM

## 2015-02-21 DIAGNOSIS — M7989 Other specified soft tissue disorders: Secondary | ICD-10-CM | POA: Diagnosis not present

## 2015-02-21 DIAGNOSIS — M25571 Pain in right ankle and joints of right foot: Secondary | ICD-10-CM | POA: Diagnosis not present

## 2015-03-06 DIAGNOSIS — F250 Schizoaffective disorder, bipolar type: Secondary | ICD-10-CM | POA: Diagnosis not present

## 2015-04-02 DIAGNOSIS — M79671 Pain in right foot: Secondary | ICD-10-CM | POA: Diagnosis not present

## 2015-04-02 DIAGNOSIS — M19071 Primary osteoarthritis, right ankle and foot: Secondary | ICD-10-CM | POA: Diagnosis not present

## 2015-04-15 DIAGNOSIS — H2513 Age-related nuclear cataract, bilateral: Secondary | ICD-10-CM | POA: Diagnosis not present

## 2015-04-15 DIAGNOSIS — H35033 Hypertensive retinopathy, bilateral: Secondary | ICD-10-CM | POA: Diagnosis not present

## 2015-04-15 DIAGNOSIS — H35341 Macular cyst, hole, or pseudohole, right eye: Secondary | ICD-10-CM | POA: Diagnosis not present

## 2015-04-23 DIAGNOSIS — H9201 Otalgia, right ear: Secondary | ICD-10-CM | POA: Diagnosis not present

## 2015-04-23 DIAGNOSIS — H6990 Unspecified Eustachian tube disorder, unspecified ear: Secondary | ICD-10-CM | POA: Diagnosis not present

## 2015-05-13 DIAGNOSIS — R7301 Impaired fasting glucose: Secondary | ICD-10-CM | POA: Diagnosis not present

## 2015-05-13 DIAGNOSIS — Z23 Encounter for immunization: Secondary | ICD-10-CM | POA: Diagnosis not present

## 2015-05-13 DIAGNOSIS — E7800 Pure hypercholesterolemia, unspecified: Secondary | ICD-10-CM | POA: Diagnosis not present

## 2015-05-13 DIAGNOSIS — E039 Hypothyroidism, unspecified: Secondary | ICD-10-CM | POA: Diagnosis not present

## 2015-05-13 DIAGNOSIS — Z0000 Encounter for general adult medical examination without abnormal findings: Secondary | ICD-10-CM | POA: Diagnosis not present

## 2015-05-13 DIAGNOSIS — E559 Vitamin D deficiency, unspecified: Secondary | ICD-10-CM | POA: Diagnosis not present

## 2015-05-13 DIAGNOSIS — Z79899 Other long term (current) drug therapy: Secondary | ICD-10-CM | POA: Diagnosis not present

## 2015-05-13 DIAGNOSIS — R197 Diarrhea, unspecified: Secondary | ICD-10-CM | POA: Diagnosis not present

## 2015-05-13 DIAGNOSIS — I10 Essential (primary) hypertension: Secondary | ICD-10-CM | POA: Diagnosis not present

## 2015-05-30 DIAGNOSIS — N182 Chronic kidney disease, stage 2 (mild): Secondary | ICD-10-CM | POA: Diagnosis not present

## 2015-05-30 DIAGNOSIS — E1121 Type 2 diabetes mellitus with diabetic nephropathy: Secondary | ICD-10-CM | POA: Diagnosis not present

## 2015-06-03 DIAGNOSIS — M19071 Primary osteoarthritis, right ankle and foot: Secondary | ICD-10-CM | POA: Diagnosis not present

## 2015-06-03 DIAGNOSIS — M79671 Pain in right foot: Secondary | ICD-10-CM | POA: Diagnosis not present

## 2015-06-19 DIAGNOSIS — F250 Schizoaffective disorder, bipolar type: Secondary | ICD-10-CM | POA: Diagnosis not present

## 2015-06-24 ENCOUNTER — Encounter: Payer: Medicare Other | Attending: Family Medicine | Admitting: Skilled Nursing Facility1

## 2015-06-24 VITALS — Ht 61.0 in | Wt 149.0 lb

## 2015-06-24 DIAGNOSIS — E119 Type 2 diabetes mellitus without complications: Secondary | ICD-10-CM

## 2015-06-24 DIAGNOSIS — E1121 Type 2 diabetes mellitus with diabetic nephropathy: Secondary | ICD-10-CM | POA: Insufficient documentation

## 2015-07-01 ENCOUNTER — Encounter: Payer: Medicare Other | Admitting: *Deleted

## 2015-07-01 ENCOUNTER — Encounter: Payer: Self-pay | Admitting: *Deleted

## 2015-07-01 DIAGNOSIS — E119 Type 2 diabetes mellitus without complications: Secondary | ICD-10-CM

## 2015-07-01 DIAGNOSIS — E1121 Type 2 diabetes mellitus with diabetic nephropathy: Secondary | ICD-10-CM | POA: Diagnosis not present

## 2015-07-01 NOTE — Progress Notes (Signed)

## 2015-07-08 ENCOUNTER — Encounter: Payer: Medicare Other | Admitting: Skilled Nursing Facility1

## 2015-07-08 ENCOUNTER — Encounter: Payer: Self-pay | Admitting: Skilled Nursing Facility1

## 2015-07-08 DIAGNOSIS — E119 Type 2 diabetes mellitus without complications: Secondary | ICD-10-CM

## 2015-07-08 DIAGNOSIS — E1121 Type 2 diabetes mellitus with diabetic nephropathy: Secondary | ICD-10-CM | POA: Diagnosis not present

## 2015-07-08 NOTE — Progress Notes (Signed)
Patient was seen on 07/08/15 for the third of a series of three diabetes self-management courses at the [facility]. The following learning objectives were met by the patient during this class:  . State the amount of activity recommended for healthy living . Describe activities suitable for individual needs . Identify ways to regularly incorporate activity into daily life . Identify barriers to activity and ways to over come these barriers  Identify diabetes medications being personally used and their primary action for lowering glucose and possible side effects . Describe role of stress on blood glucose and develop strategies to address psychosocial issues . Identify diabetes complications and ways to prevent them  Explain how to manage diabetes during illness . Evaluate success in meeting personal goal . Establish 2-3 goals that they will plan to diligently work on until they return for the  38-monthfollow-up visit  Goals:   I will count my carb choices at most meals and snacks  I will be active  minutes or more  times a week  I will take my diabetes medications as scheduled  I will eat less unhealthy fats by eating less   I will test my glucose at least  times a day,  days a week  I will look at patterns in my record book at least  days a month  To help manage stress I will   at least  times a week  Your patient has identified these potential barriers to change:  Motivation Finances Stress Lack of Family Support   Your patient has identified their diabetes self-care support plan as  [facility]Support Group Family SForensic scientistResources  Plan:  Attend Core 4 in 4 months

## 2015-07-22 DIAGNOSIS — F250 Schizoaffective disorder, bipolar type: Secondary | ICD-10-CM | POA: Diagnosis not present

## 2015-07-30 DIAGNOSIS — H6091 Unspecified otitis externa, right ear: Secondary | ICD-10-CM | POA: Diagnosis not present

## 2015-09-02 DIAGNOSIS — E1121 Type 2 diabetes mellitus with diabetic nephropathy: Secondary | ICD-10-CM | POA: Diagnosis not present

## 2015-09-02 DIAGNOSIS — N182 Chronic kidney disease, stage 2 (mild): Secondary | ICD-10-CM | POA: Diagnosis not present

## 2015-09-02 DIAGNOSIS — F250 Schizoaffective disorder, bipolar type: Secondary | ICD-10-CM | POA: Diagnosis not present

## 2015-09-02 DIAGNOSIS — E559 Vitamin D deficiency, unspecified: Secondary | ICD-10-CM | POA: Diagnosis not present

## 2015-09-19 ENCOUNTER — Other Ambulatory Visit: Payer: Self-pay | Admitting: Family Medicine

## 2015-09-19 DIAGNOSIS — Z1231 Encounter for screening mammogram for malignant neoplasm of breast: Secondary | ICD-10-CM

## 2015-10-06 ENCOUNTER — Ambulatory Visit
Admission: RE | Admit: 2015-10-06 | Discharge: 2015-10-06 | Disposition: A | Discharge status: Home / Self Care | Payer: Medicare Other | Source: Ambulatory Visit | Attending: Family Medicine | Admitting: Family Medicine

## 2015-10-06 DIAGNOSIS — Z1231 Encounter for screening mammogram for malignant neoplasm of breast: Secondary | ICD-10-CM | POA: Diagnosis not present

## 2015-10-15 DIAGNOSIS — F250 Schizoaffective disorder, bipolar type: Secondary | ICD-10-CM | POA: Diagnosis not present

## 2016-01-06 DIAGNOSIS — F250 Schizoaffective disorder, bipolar type: Secondary | ICD-10-CM | POA: Diagnosis not present

## 2016-01-21 DIAGNOSIS — R0981 Nasal congestion: Secondary | ICD-10-CM | POA: Diagnosis not present

## 2016-01-21 DIAGNOSIS — K148 Other diseases of tongue: Secondary | ICD-10-CM | POA: Diagnosis not present

## 2016-01-21 DIAGNOSIS — H9201 Otalgia, right ear: Secondary | ICD-10-CM | POA: Diagnosis not present

## 2016-03-18 DIAGNOSIS — H9209 Otalgia, unspecified ear: Secondary | ICD-10-CM | POA: Diagnosis not present

## 2016-03-18 DIAGNOSIS — K148 Other diseases of tongue: Secondary | ICD-10-CM | POA: Diagnosis not present

## 2016-03-18 DIAGNOSIS — L84 Corns and callosities: Secondary | ICD-10-CM | POA: Diagnosis not present

## 2016-04-01 DIAGNOSIS — F250 Schizoaffective disorder, bipolar type: Secondary | ICD-10-CM | POA: Diagnosis not present

## 2016-04-14 DIAGNOSIS — E119 Type 2 diabetes mellitus without complications: Secondary | ICD-10-CM | POA: Diagnosis not present

## 2016-04-14 DIAGNOSIS — H43811 Vitreous degeneration, right eye: Secondary | ICD-10-CM | POA: Diagnosis not present

## 2016-04-14 DIAGNOSIS — H2513 Age-related nuclear cataract, bilateral: Secondary | ICD-10-CM | POA: Diagnosis not present

## 2016-05-27 ENCOUNTER — Other Ambulatory Visit: Payer: Self-pay | Admitting: Family Medicine

## 2016-05-27 ENCOUNTER — Other Ambulatory Visit (HOSPITAL_COMMUNITY)
Admission: RE | Admit: 2016-05-27 | Discharge: 2016-05-27 | Disposition: A | Discharge status: Home / Self Care | Payer: Medicare Other | Source: Ambulatory Visit | Attending: Family Medicine | Admitting: Family Medicine

## 2016-05-27 DIAGNOSIS — E7800 Pure hypercholesterolemia, unspecified: Secondary | ICD-10-CM | POA: Diagnosis not present

## 2016-05-27 DIAGNOSIS — Z6827 Body mass index (BMI) 27.0-27.9, adult: Secondary | ICD-10-CM | POA: Diagnosis not present

## 2016-05-27 DIAGNOSIS — I10 Essential (primary) hypertension: Secondary | ICD-10-CM | POA: Diagnosis not present

## 2016-05-27 DIAGNOSIS — Z01411 Encounter for gynecological examination (general) (routine) with abnormal findings: Secondary | ICD-10-CM | POA: Insufficient documentation

## 2016-05-27 DIAGNOSIS — F17210 Nicotine dependence, cigarettes, uncomplicated: Secondary | ICD-10-CM | POA: Diagnosis not present

## 2016-05-27 DIAGNOSIS — E1121 Type 2 diabetes mellitus with diabetic nephropathy: Secondary | ICD-10-CM | POA: Diagnosis not present

## 2016-05-27 DIAGNOSIS — Z23 Encounter for immunization: Secondary | ICD-10-CM | POA: Diagnosis not present

## 2016-05-27 DIAGNOSIS — Z0000 Encounter for general adult medical examination without abnormal findings: Secondary | ICD-10-CM | POA: Diagnosis not present

## 2016-05-27 DIAGNOSIS — E039 Hypothyroidism, unspecified: Secondary | ICD-10-CM | POA: Diagnosis not present

## 2016-05-27 DIAGNOSIS — Z79899 Other long term (current) drug therapy: Secondary | ICD-10-CM | POA: Diagnosis not present

## 2016-05-27 DIAGNOSIS — N182 Chronic kidney disease, stage 2 (mild): Secondary | ICD-10-CM | POA: Diagnosis not present

## 2016-05-28 ENCOUNTER — Other Ambulatory Visit: Payer: Self-pay | Admitting: Family Medicine

## 2016-05-28 DIAGNOSIS — F17210 Nicotine dependence, cigarettes, uncomplicated: Secondary | ICD-10-CM

## 2016-05-28 LAB — CYTOLOGY - PAP: DIAGNOSIS: NEGATIVE

## 2016-05-31 DIAGNOSIS — H93291 Other abnormal auditory perceptions, right ear: Secondary | ICD-10-CM | POA: Diagnosis not present

## 2016-05-31 DIAGNOSIS — K143 Hypertrophy of tongue papillae: Secondary | ICD-10-CM | POA: Insufficient documentation

## 2016-06-04 DIAGNOSIS — Z1211 Encounter for screening for malignant neoplasm of colon: Secondary | ICD-10-CM | POA: Diagnosis not present

## 2016-06-09 DIAGNOSIS — H93293 Other abnormal auditory perceptions, bilateral: Secondary | ICD-10-CM | POA: Diagnosis not present

## 2016-06-16 DIAGNOSIS — F250 Schizoaffective disorder, bipolar type: Secondary | ICD-10-CM | POA: Diagnosis not present

## 2016-08-30 DIAGNOSIS — F250 Schizoaffective disorder, bipolar type: Secondary | ICD-10-CM | POA: Diagnosis not present

## 2016-09-16 ENCOUNTER — Other Ambulatory Visit: Payer: Self-pay | Admitting: Family Medicine

## 2016-09-20 ENCOUNTER — Other Ambulatory Visit: Payer: Self-pay | Admitting: Family Medicine

## 2016-09-20 DIAGNOSIS — Z1231 Encounter for screening mammogram for malignant neoplasm of breast: Secondary | ICD-10-CM

## 2016-09-20 DIAGNOSIS — E2839 Other primary ovarian failure: Secondary | ICD-10-CM

## 2016-10-12 ENCOUNTER — Ambulatory Visit
Admission: RE | Admit: 2016-10-12 | Discharge: 2016-10-12 | Disposition: A | Discharge status: Home / Self Care | Payer: Medicare Other | Source: Ambulatory Visit | Attending: Family Medicine | Admitting: Family Medicine

## 2016-10-12 DIAGNOSIS — Z1231 Encounter for screening mammogram for malignant neoplasm of breast: Secondary | ICD-10-CM

## 2016-10-12 DIAGNOSIS — Z780 Asymptomatic menopausal state: Secondary | ICD-10-CM | POA: Diagnosis not present

## 2016-10-12 DIAGNOSIS — M810 Age-related osteoporosis without current pathological fracture: Secondary | ICD-10-CM | POA: Diagnosis not present

## 2016-10-12 DIAGNOSIS — E2839 Other primary ovarian failure: Secondary | ICD-10-CM

## 2016-11-22 DIAGNOSIS — F250 Schizoaffective disorder, bipolar type: Secondary | ICD-10-CM | POA: Diagnosis not present

## 2017-03-09 DIAGNOSIS — F250 Schizoaffective disorder, bipolar type: Secondary | ICD-10-CM | POA: Diagnosis not present

## 2017-04-14 DIAGNOSIS — H35341 Macular cyst, hole, or pseudohole, right eye: Secondary | ICD-10-CM | POA: Diagnosis not present

## 2017-04-14 DIAGNOSIS — E119 Type 2 diabetes mellitus without complications: Secondary | ICD-10-CM | POA: Diagnosis not present

## 2017-04-14 DIAGNOSIS — H43811 Vitreous degeneration, right eye: Secondary | ICD-10-CM | POA: Diagnosis not present

## 2017-04-14 DIAGNOSIS — H2513 Age-related nuclear cataract, bilateral: Secondary | ICD-10-CM | POA: Diagnosis not present

## 2017-07-07 DIAGNOSIS — N182 Chronic kidney disease, stage 2 (mild): Secondary | ICD-10-CM | POA: Diagnosis not present

## 2017-07-07 DIAGNOSIS — E039 Hypothyroidism, unspecified: Secondary | ICD-10-CM | POA: Diagnosis not present

## 2017-07-07 DIAGNOSIS — H35033 Hypertensive retinopathy, bilateral: Secondary | ICD-10-CM | POA: Diagnosis not present

## 2017-07-07 DIAGNOSIS — E1121 Type 2 diabetes mellitus with diabetic nephropathy: Secondary | ICD-10-CM | POA: Diagnosis not present

## 2017-07-07 DIAGNOSIS — Z0000 Encounter for general adult medical examination without abnormal findings: Secondary | ICD-10-CM | POA: Diagnosis not present

## 2017-07-07 DIAGNOSIS — Z79899 Other long term (current) drug therapy: Secondary | ICD-10-CM | POA: Diagnosis not present

## 2017-07-07 DIAGNOSIS — J449 Chronic obstructive pulmonary disease, unspecified: Secondary | ICD-10-CM | POA: Diagnosis not present

## 2017-07-07 DIAGNOSIS — E7800 Pure hypercholesterolemia, unspecified: Secondary | ICD-10-CM | POA: Diagnosis not present

## 2017-07-07 DIAGNOSIS — Z1211 Encounter for screening for malignant neoplasm of colon: Secondary | ICD-10-CM | POA: Diagnosis not present

## 2017-07-07 DIAGNOSIS — M810 Age-related osteoporosis without current pathological fracture: Secondary | ICD-10-CM | POA: Diagnosis not present

## 2017-07-07 DIAGNOSIS — Z1159 Encounter for screening for other viral diseases: Secondary | ICD-10-CM | POA: Diagnosis not present

## 2017-07-07 DIAGNOSIS — E559 Vitamin D deficiency, unspecified: Secondary | ICD-10-CM | POA: Diagnosis not present

## 2017-07-07 DIAGNOSIS — I10 Essential (primary) hypertension: Secondary | ICD-10-CM | POA: Diagnosis not present

## 2017-08-18 DIAGNOSIS — F250 Schizoaffective disorder, bipolar type: Secondary | ICD-10-CM | POA: Diagnosis not present

## 2017-09-01 ENCOUNTER — Other Ambulatory Visit: Payer: Self-pay | Admitting: Family Medicine

## 2017-09-01 DIAGNOSIS — Z1231 Encounter for screening mammogram for malignant neoplasm of breast: Secondary | ICD-10-CM

## 2017-09-12 DIAGNOSIS — M79672 Pain in left foot: Secondary | ICD-10-CM | POA: Diagnosis not present

## 2017-09-12 DIAGNOSIS — M2042 Other hammer toe(s) (acquired), left foot: Secondary | ICD-10-CM | POA: Diagnosis not present

## 2017-09-12 DIAGNOSIS — M2041 Other hammer toe(s) (acquired), right foot: Secondary | ICD-10-CM | POA: Diagnosis not present

## 2017-09-12 DIAGNOSIS — B079 Viral wart, unspecified: Secondary | ICD-10-CM | POA: Diagnosis not present

## 2017-09-12 DIAGNOSIS — M79671 Pain in right foot: Secondary | ICD-10-CM | POA: Diagnosis not present

## 2017-10-05 DIAGNOSIS — M79671 Pain in right foot: Secondary | ICD-10-CM | POA: Diagnosis not present

## 2017-10-05 DIAGNOSIS — B079 Viral wart, unspecified: Secondary | ICD-10-CM | POA: Diagnosis not present

## 2017-10-05 DIAGNOSIS — M79672 Pain in left foot: Secondary | ICD-10-CM | POA: Diagnosis not present

## 2017-10-17 ENCOUNTER — Ambulatory Visit
Admission: RE | Admit: 2017-10-17 | Discharge: 2017-10-17 | Disposition: A | Discharge status: Home / Self Care | Payer: Medicare Other | Source: Ambulatory Visit | Attending: Family Medicine | Admitting: Family Medicine

## 2017-10-17 DIAGNOSIS — Z1231 Encounter for screening mammogram for malignant neoplasm of breast: Secondary | ICD-10-CM

## 2017-10-21 DIAGNOSIS — M79672 Pain in left foot: Secondary | ICD-10-CM | POA: Diagnosis not present

## 2017-10-21 DIAGNOSIS — M79671 Pain in right foot: Secondary | ICD-10-CM | POA: Diagnosis not present

## 2017-10-21 DIAGNOSIS — B079 Viral wart, unspecified: Secondary | ICD-10-CM | POA: Diagnosis not present

## 2017-11-07 DIAGNOSIS — B079 Viral wart, unspecified: Secondary | ICD-10-CM | POA: Diagnosis not present

## 2017-11-07 DIAGNOSIS — M79671 Pain in right foot: Secondary | ICD-10-CM | POA: Diagnosis not present

## 2017-11-07 DIAGNOSIS — M79672 Pain in left foot: Secondary | ICD-10-CM | POA: Diagnosis not present

## 2017-11-15 DIAGNOSIS — Z23 Encounter for immunization: Secondary | ICD-10-CM | POA: Diagnosis not present

## 2017-11-23 DIAGNOSIS — F250 Schizoaffective disorder, bipolar type: Secondary | ICD-10-CM | POA: Diagnosis not present

## 2017-12-05 DIAGNOSIS — L97512 Non-pressure chronic ulcer of other part of right foot with fat layer exposed: Secondary | ICD-10-CM | POA: Diagnosis not present

## 2017-12-22 DIAGNOSIS — B351 Tinea unguium: Secondary | ICD-10-CM | POA: Diagnosis not present

## 2017-12-22 DIAGNOSIS — L600 Ingrowing nail: Secondary | ICD-10-CM | POA: Diagnosis not present

## 2018-01-25 DIAGNOSIS — M79675 Pain in left toe(s): Secondary | ICD-10-CM | POA: Diagnosis not present

## 2018-01-25 DIAGNOSIS — L600 Ingrowing nail: Secondary | ICD-10-CM | POA: Diagnosis not present

## 2018-01-25 DIAGNOSIS — M79674 Pain in right toe(s): Secondary | ICD-10-CM | POA: Diagnosis not present

## 2018-01-25 DIAGNOSIS — B351 Tinea unguium: Secondary | ICD-10-CM | POA: Diagnosis not present

## 2018-02-16 DIAGNOSIS — F250 Schizoaffective disorder, bipolar type: Secondary | ICD-10-CM | POA: Diagnosis not present

## 2018-02-23 DIAGNOSIS — B351 Tinea unguium: Secondary | ICD-10-CM | POA: Diagnosis not present

## 2018-02-23 DIAGNOSIS — L600 Ingrowing nail: Secondary | ICD-10-CM | POA: Diagnosis not present

## 2018-03-24 DIAGNOSIS — L600 Ingrowing nail: Secondary | ICD-10-CM | POA: Diagnosis not present

## 2018-03-24 DIAGNOSIS — B351 Tinea unguium: Secondary | ICD-10-CM | POA: Diagnosis not present

## 2018-03-31 DIAGNOSIS — B351 Tinea unguium: Secondary | ICD-10-CM | POA: Diagnosis not present

## 2018-05-25 DIAGNOSIS — F250 Schizoaffective disorder, bipolar type: Secondary | ICD-10-CM | POA: Diagnosis not present

## 2018-07-28 DIAGNOSIS — L600 Ingrowing nail: Secondary | ICD-10-CM | POA: Diagnosis not present

## 2018-07-28 DIAGNOSIS — B351 Tinea unguium: Secondary | ICD-10-CM | POA: Diagnosis not present

## 2018-08-04 ENCOUNTER — Encounter (HOSPITAL_COMMUNITY): Payer: Medicare Other

## 2018-08-04 ENCOUNTER — Encounter (HOSPITAL_COMMUNITY): Payer: Self-pay | Admitting: Emergency Medicine

## 2018-08-04 ENCOUNTER — Inpatient Hospital Stay (HOSPITAL_COMMUNITY): Payer: Medicare Other

## 2018-08-04 ENCOUNTER — Inpatient Hospital Stay (HOSPITAL_COMMUNITY)
Admission: EM | Admit: 2018-08-04 | Discharge: 2018-08-10 | DRG: 066 | Disposition: A | Discharge status: Skilled Nursing Facility | Payer: Medicare Other | Attending: Internal Medicine | Admitting: Internal Medicine

## 2018-08-04 ENCOUNTER — Emergency Department (HOSPITAL_COMMUNITY): Payer: Medicare Other

## 2018-08-04 ENCOUNTER — Other Ambulatory Visit: Payer: Self-pay

## 2018-08-04 DIAGNOSIS — Z79899 Other long term (current) drug therapy: Secondary | ICD-10-CM | POA: Diagnosis not present

## 2018-08-04 DIAGNOSIS — F259 Schizoaffective disorder, unspecified: Secondary | ICD-10-CM | POA: Diagnosis not present

## 2018-08-04 DIAGNOSIS — R29701 NIHSS score 1: Secondary | ICD-10-CM | POA: Diagnosis not present

## 2018-08-04 DIAGNOSIS — E039 Hypothyroidism, unspecified: Secondary | ICD-10-CM | POA: Diagnosis present

## 2018-08-04 DIAGNOSIS — Z20828 Contact with and (suspected) exposure to other viral communicable diseases: Secondary | ICD-10-CM | POA: Diagnosis not present

## 2018-08-04 DIAGNOSIS — Z741 Need for assistance with personal care: Secondary | ICD-10-CM | POA: Diagnosis not present

## 2018-08-04 DIAGNOSIS — I63312 Cerebral infarction due to thrombosis of left middle cerebral artery: Secondary | ICD-10-CM

## 2018-08-04 DIAGNOSIS — I639 Cerebral infarction, unspecified: Secondary | ICD-10-CM

## 2018-08-04 DIAGNOSIS — R471 Dysarthria and anarthria: Secondary | ICD-10-CM | POA: Diagnosis not present

## 2018-08-04 DIAGNOSIS — R29810 Facial weakness: Secondary | ICD-10-CM | POA: Diagnosis present

## 2018-08-04 DIAGNOSIS — R4781 Slurred speech: Secondary | ICD-10-CM | POA: Diagnosis present

## 2018-08-04 DIAGNOSIS — Z87891 Personal history of nicotine dependence: Secondary | ICD-10-CM | POA: Diagnosis not present

## 2018-08-04 DIAGNOSIS — D509 Iron deficiency anemia, unspecified: Secondary | ICD-10-CM | POA: Diagnosis present

## 2018-08-04 DIAGNOSIS — Z720 Tobacco use: Secondary | ICD-10-CM | POA: Diagnosis present

## 2018-08-04 DIAGNOSIS — R41841 Cognitive communication deficit: Secondary | ICD-10-CM | POA: Diagnosis not present

## 2018-08-04 DIAGNOSIS — Z03818 Encounter for observation for suspected exposure to other biological agents ruled out: Secondary | ICD-10-CM | POA: Diagnosis not present

## 2018-08-04 DIAGNOSIS — Z7401 Bed confinement status: Secondary | ICD-10-CM | POA: Diagnosis not present

## 2018-08-04 DIAGNOSIS — I959 Hypotension, unspecified: Secondary | ICD-10-CM | POA: Diagnosis not present

## 2018-08-04 DIAGNOSIS — M2550 Pain in unspecified joint: Secondary | ICD-10-CM | POA: Diagnosis not present

## 2018-08-04 DIAGNOSIS — G459 Transient cerebral ischemic attack, unspecified: Secondary | ICD-10-CM | POA: Diagnosis not present

## 2018-08-04 DIAGNOSIS — I4519 Other right bundle-branch block: Secondary | ICD-10-CM | POA: Diagnosis present

## 2018-08-04 DIAGNOSIS — Z79890 Hormone replacement therapy (postmenopausal): Secondary | ICD-10-CM

## 2018-08-04 DIAGNOSIS — M6281 Muscle weakness (generalized): Secondary | ICD-10-CM | POA: Diagnosis not present

## 2018-08-04 DIAGNOSIS — Z8249 Family history of ischemic heart disease and other diseases of the circulatory system: Secondary | ICD-10-CM

## 2018-08-04 DIAGNOSIS — Z7982 Long term (current) use of aspirin: Secondary | ICD-10-CM | POA: Diagnosis not present

## 2018-08-04 DIAGNOSIS — I6381 Other cerebral infarction due to occlusion or stenosis of small artery: Principal | ICD-10-CM | POA: Diagnosis present

## 2018-08-04 DIAGNOSIS — I6350 Cerebral infarction due to unspecified occlusion or stenosis of unspecified cerebral artery: Secondary | ICD-10-CM | POA: Diagnosis not present

## 2018-08-04 DIAGNOSIS — E785 Hyperlipidemia, unspecified: Secondary | ICD-10-CM | POA: Diagnosis present

## 2018-08-04 DIAGNOSIS — R1319 Other dysphagia: Secondary | ICD-10-CM | POA: Diagnosis not present

## 2018-08-04 DIAGNOSIS — I469 Cardiac arrest, cause unspecified: Secondary | ICD-10-CM | POA: Diagnosis not present

## 2018-08-04 DIAGNOSIS — I371 Nonrheumatic pulmonary valve insufficiency: Secondary | ICD-10-CM | POA: Diagnosis not present

## 2018-08-04 DIAGNOSIS — I10 Essential (primary) hypertension: Secondary | ICD-10-CM | POA: Diagnosis not present

## 2018-08-04 DIAGNOSIS — I6523 Occlusion and stenosis of bilateral carotid arteries: Secondary | ICD-10-CM | POA: Diagnosis not present

## 2018-08-04 DIAGNOSIS — R2681 Unsteadiness on feet: Secondary | ICD-10-CM | POA: Diagnosis not present

## 2018-08-04 DIAGNOSIS — R739 Hyperglycemia, unspecified: Secondary | ICD-10-CM | POA: Diagnosis present

## 2018-08-04 HISTORY — DX: Schizoaffective disorder, unspecified: F25.9

## 2018-08-04 HISTORY — DX: Hypothyroidism, unspecified: E03.9

## 2018-08-04 HISTORY — DX: Tobacco use: Z72.0

## 2018-08-04 LAB — CBC WITH DIFFERENTIAL/PLATELET
Abs Immature Granulocytes: 0.02 10*3/uL (ref 0.00–0.07)
Basophils Absolute: 0 10*3/uL (ref 0.0–0.1)
Basophils Relative: 0 %
Eosinophils Absolute: 0.1 10*3/uL (ref 0.0–0.5)
Eosinophils Relative: 2 %
HCT: 36.9 % (ref 36.0–46.0)
Hemoglobin: 11.4 g/dL — ABNORMAL LOW (ref 12.0–15.0)
Immature Granulocytes: 0 %
Lymphocytes Relative: 24 %
Lymphs Abs: 1.7 10*3/uL (ref 0.7–4.0)
MCH: 25.6 pg — ABNORMAL LOW (ref 26.0–34.0)
MCHC: 30.9 g/dL (ref 30.0–36.0)
MCV: 82.9 fL (ref 80.0–100.0)
Monocytes Absolute: 0.6 10*3/uL (ref 0.1–1.0)
Monocytes Relative: 8 %
Neutro Abs: 4.7 10*3/uL (ref 1.7–7.7)
Neutrophils Relative %: 66 %
Platelets: 204 10*3/uL (ref 150–400)
RBC: 4.45 MIL/uL (ref 3.87–5.11)
RDW: 16.2 % — ABNORMAL HIGH (ref 11.5–15.5)
WBC: 7.1 10*3/uL (ref 4.0–10.5)
nRBC: 0 % (ref 0.0–0.2)

## 2018-08-04 LAB — TSH: TSH: 3.276 u[IU]/mL (ref 0.350–4.500)

## 2018-08-04 LAB — COMPREHENSIVE METABOLIC PANEL
ALT: 22 U/L (ref 0–44)
AST: 20 U/L (ref 15–41)
Albumin: 3.4 g/dL — ABNORMAL LOW (ref 3.5–5.0)
Alkaline Phosphatase: 80 U/L (ref 38–126)
Anion gap: 7 (ref 5–15)
BUN: 12 mg/dL (ref 8–23)
CO2: 29 mmol/L (ref 22–32)
Calcium: 9.6 mg/dL (ref 8.9–10.3)
Chloride: 108 mmol/L (ref 98–111)
Creatinine, Ser: 0.98 mg/dL (ref 0.44–1.00)
GFR calc Af Amer: >60 mL/min (ref >60)
GFR calc non Af Amer: 59 mL/min — ABNORMAL LOW (ref >60)
Glucose, Bld: 92 mg/dL (ref 70–99)
Potassium: 3.9 mmol/L (ref 3.5–5.1)
Sodium: 144 mmol/L (ref 135–145)
Total Bilirubin: 0.6 mg/dL (ref 0.3–1.2)
Total Protein: 6.5 g/dL (ref 6.5–8.1)

## 2018-08-04 LAB — CBG MONITORING, ED: Glucose-Capillary: 89 mg/dL (ref 70–99)

## 2018-08-04 LAB — URINALYSIS, ROUTINE W REFLEX MICROSCOPIC
Bilirubin Urine: NEGATIVE
Glucose, UA: NEGATIVE mg/dL
Hgb urine dipstick: NEGATIVE
Ketones, ur: NEGATIVE mg/dL
Leukocytes,Ua: NEGATIVE
Nitrite: NEGATIVE
Protein, ur: NEGATIVE mg/dL
Specific Gravity, Urine: 1.016 (ref 1.005–1.030)
pH: 7 (ref 5.0–8.0)

## 2018-08-04 LAB — SARS CORONAVIRUS 2 BY RT PCR (HOSPITAL ORDER, PERFORMED IN ~~LOC~~ HOSPITAL LAB): SARS Coronavirus 2: NEGATIVE

## 2018-08-04 MED ORDER — ACETAMINOPHEN 650 MG RE SUPP: 650.0000 mg | RECTAL | Status: DC | PRN

## 2018-08-04 MED ORDER — ACETAMINOPHEN 325 MG PO TABS
650.0000 mg | ORAL_TABLET | ORAL | Status: DC | PRN
  Administered 2018-08-09: 650 mg via ORAL
  Filled 2018-08-04: qty 2

## 2018-08-04 MED ORDER — STROKE: EARLY STAGES OF RECOVERY BOOK
Freq: Once | Status: AC
  Administered 2018-08-04: 16:00:00

## 2018-08-04 MED ORDER — LEVOTHYROXINE SODIUM 100 MCG PO TABS: 100.0000 ug | ORAL_TABLET | Freq: Every day | ORAL | Status: DC

## 2018-08-04 MED ORDER — ATORVASTATIN CALCIUM 80 MG PO TABS
80.0000 mg | ORAL_TABLET | Freq: Every day | ORAL | Status: DC
  Administered 2018-08-04: 80 mg via ORAL
  Filled 2018-08-04 (×2): qty 1

## 2018-08-04 MED ORDER — CITALOPRAM HYDROBROMIDE 10 MG PO TABS
20.0000 mg | ORAL_TABLET | Freq: Every day | ORAL | Status: DC
  Administered 2018-08-05 – 2018-08-10 (×6): 20 mg via ORAL
  Filled 2018-08-04 (×6): qty 2

## 2018-08-04 MED ORDER — ASPIRIN EC 81 MG PO TBEC
81.0000 mg | DELAYED_RELEASE_TABLET | Freq: Every day | ORAL | Status: DC
  Administered 2018-08-05 – 2018-08-10 (×6): 81 mg via ORAL
  Filled 2018-08-04 (×6): qty 1

## 2018-08-04 MED ORDER — SENNOSIDES-DOCUSATE SODIUM 8.6-50 MG PO TABS: 1.0000 | ORAL_TABLET | Freq: Every evening | ORAL | Status: DC | PRN

## 2018-08-04 MED ORDER — OLANZAPINE 10 MG PO TABS
10.0000 mg | ORAL_TABLET | Freq: Every day | ORAL | Status: DC
  Administered 2018-08-05 – 2018-08-10 (×6): 10 mg via ORAL
  Filled 2018-08-04 (×6): qty 1

## 2018-08-04 MED ORDER — ACETAMINOPHEN 160 MG/5ML PO SOLN: 650.0000 mg | ORAL | Status: DC | PRN

## 2018-08-04 MED ORDER — ENOXAPARIN SODIUM 40 MG/0.4ML ~~LOC~~ SOLN
40.0000 mg | SUBCUTANEOUS | Status: DC
  Administered 2018-08-04 – 2018-08-09 (×6): 40 mg via SUBCUTANEOUS
  Filled 2018-08-04 (×6): qty 0.4

## 2018-08-04 MED ORDER — TRAZODONE HCL 50 MG PO TABS
50.0000 mg | ORAL_TABLET | Freq: Every day | ORAL | Status: DC
  Administered 2018-08-04 – 2018-08-09 (×6): 50 mg via ORAL
  Filled 2018-08-04 (×7): qty 1

## 2018-08-04 MED ORDER — LEVOTHYROXINE SODIUM 50 MCG PO TABS
50.0000 ug | ORAL_TABLET | Freq: Every day | ORAL | Status: DC
  Administered 2018-08-05 – 2018-08-10 (×6): 50 ug via ORAL
  Filled 2018-08-04 (×6): qty 1

## 2018-08-04 MED ORDER — ASPIRIN 325 MG PO TABS
325.0000 mg | ORAL_TABLET | Freq: Once | ORAL | Status: AC
  Administered 2018-08-04: 325 mg via ORAL
  Filled 2018-08-04: qty 1

## 2018-08-04 NOTE — Evaluation (Signed)
Speech Language Pathology Evaluation Patient Details Name: [name] Day MRN: [id number] DOB: 1950/06/15 Today's Date: 08/04/2018 Time: [id number] SLP Time Calculation (min) (ACUTE ONLY): 21 min  Problem List:  Patient Active Problem List   Diagnosis Date Noted  . CVA (cerebral vascular accident) (HCC) 08/04/2018   Past Medical History:  Past Medical History:  Diagnosis Date  . Hyperlipidemia   . Hypothyroidism   . Schizoaffective disorder (HCC)   . Tobacco abuse    Past Surgical History: History reviewed. No pertinent surgical history. HPI:  Pt is a 68 y.o. female with medical history significant of HLD, schizoaffective disorder, and hypothyroidism; who presented with complaints of at least two days of slurred speech. 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  Possible chronic lacune in the posterior right lentiform. Patchy cerebral white matter signal changes which might also reflect chronic small vessel disease.   Assessment / Plan / Recommendation Clinical Impression  Pt reported that she was living independently prior to admission. She denied any baseline deficits in speech, language or cognition but stated that her speech is currently "slurred". She initially stated that her speech was 90% back to baseline but at the end of the session indicated that she actually believes it is only 10% back to baseline. Her language skills were within normal limits but she presented with mild to moderate dysarthria characterized by reduced articulatory precision and a reduced vocal intensity. These factors resulted in reduced speech intelligibility at the sentence and conversational levels. Pt demonstrated difficulty with memory and task sequencing but she stated that she has always had some difficutly with memory. Continued skilled SLP services are clinically indicated at this time to target dysarthria. Pt, and nursing, were educated  regarding results and recommendations; both parties verbalized understanding as well as agreement with plan of care.    SLP Assessment  SLP Recommendation/Assessment: Patient needs continued Speech Lanaguage Pathology Services SLP Visit Diagnosis: Dysarthria and anarthria (R47.1)    Follow Up Recommendations  Other (comment)(TBD based on her progress)    Frequency and Duration min 2x/week  2 weeks      SLP Evaluation Cognition  Overall Cognitive Status: No family/caregiver present to determine baseline cognitive functioning Arousal/Alertness: Awake/alert Orientation Level: Oriented X4 Attention: Focused;Sustained Focused Attention: Appears intact Sustained Attention: Appears intact Memory: Impaired Memory Impairment: Retrieval deficit;Decreased recall of new information(Immediate: 3/3; delayed: 0/3; with cues: 0/3) Awareness: Appears intact Problem Solving: Appears intact Executive Function: Reasoning;Sequencing Sequencing: Impaired Sequencing Impairment: Verbal complex       Comprehension  Auditory Comprehension Overall Auditory Comprehension: Appears within functional limits for tasks assessed Yes/No Questions: Within Functional Limits Basic Biographical Questions: (5/5) Complex Questions: (5/5) Paragraph Comprehension (via yes/no questions): (3/4) Commands: Impaired Two Step Basic Commands: (4/4) Multistep Basic Commands: (2/4; 4/4 with repetition) Conversation: Complex Visual Recognition/Discrimination Discrimination: Within Function Limits Reading Comprehension Reading Status: Within funtional limits    Expression Expression Primary Mode of Expression: Verbal Verbal Expression Overall Verbal Expression: Appears within functional limits for tasks assessed Initiation: No impairment Automatic Speech: Counting;Day of week;Month of year(WNL) Level of Generative/Spontaneous Verbalization: Conversation Repetition: No impairment(5/5) Naming: No  impairment Responsive: (3/5) Confrontation: Within functional limits(10/10) Convergent: (Sentence completion: 3/3) Pragmatics: No impairment   Oral / Motor  Oral Motor/Sensory Function Overall Oral Motor/Sensory Function: Mild impairment Facial Strength: Suspected CN VII (facial) dysfunction;Reduced right Facial Sensation: Within Functional Limits Lingual Strength: Reduced;Suspected CN XII (hypoglossal) dysfunction Motor Speech Overall Motor Speech: Impaired Respiration:  Within functional limits Phonation: Low vocal intensity Resonance: Hypernasality Articulation: Impaired Level of Impairment: Sentence Intelligibility: Intelligibility reduced Word: 75-100% accurate Phrase: 50-74% accurate Sentence: 50-74% accurate Conversation: 50-74% accurate Motor Planning: Witnin functional limits Motor Speech Errors: Aware;Consistent Effective Techniques: Slow rate;Over-articulate;Increased vocal intensity   Eliav Mechling I. [name], [place], [place] Office number [phone] Pager [phone]                    [name] Day 08/04/2018, 4:07 PM

## 2018-08-04 NOTE — Progress Notes (Signed)
Carotid duplex       has been completed. Preliminary results can be found under CV proc through chart review. Kaicen Desena, BS, RDMS, RVT   

## 2018-08-04 NOTE — H&P (Signed)
History and Physical    [name] Day YNW:[id number] DOB: 03/05/1950 DOA: 08/04/2018  Referring MD/NP/PA: Thayer Ohmhris lawyer PCP: Mila PalmerWolters, Sharon, MD  Patient coming from: Home  Chief Complaint: Slurred speech  I have personally briefly reviewed patient's old medical records in [facility]   HPI: [name] Day is a 68 y.o. right-hand-dominant female with medical history significant of HLD, schizoaffective disorder, and hypothyroidism; who presents with complaints of at least 2 days of slurred speech.  History mostly obtained from the patient from home history is somewhat limited due to speech.  Notes of some possible weakness, but does not necessarily localize it to 1 specific side.  She reports previous injury with a Q-tip causing some discoloration of the right side of her face.  Associated symptoms include some shortness of breath.  Patient reports smoking for at least 20 some years approximately 1 pack a Day on average but quit in 1 to 2 months ago.  Denies having any palpitations, chest pain, nausea, vomiting, cough, dysuria, or recent falls.  She is on daily aspirin.  ED Course: Upon admission into the emergency department patient was noted to be afebrile, pulse 51-54, and all other vital signs maintained.  Labs revealed hemoglobin 11.4, and labs otherwise were noted to be within normal limits.  MRI of the brain revealed acute  medial left lentiform and/or internal capsule infarcts.  Neurology was consulted by the ED provider.  TRH called to admit  Review of Systems  Constitutional: Negative for fever and malaise/fatigue.  HENT: Negative for ear discharge and nosebleeds.   Eyes: Negative for blurred vision and double vision.  Respiratory: Positive for shortness of breath. Negative for cough.   Cardiovascular: Negative for chest pain, palpitations and leg swelling.  Gastrointestinal: Negative for nausea and vomiting.  Genitourinary: Negative for dysuria and hematuria.   Musculoskeletal: Negative for falls.  Skin: Negative for itching.  Neurological: Positive for speech change and focal weakness. Negative for loss of consciousness.  Psychiatric/Behavioral: Negative for substance abuse.    Past Medical History:  Diagnosis Date  . Hyperlipidemia   . Hypothyroidism   . Schizoaffective disorder (HCC)   . Tobacco abuse     History reviewed. No pertinent surgical history.   reports that she has never smoked. She has never used smokeless tobacco. She reports previous drug use. She reports that she does not drink alcohol.  No Known Allergies  Family History  Problem Relation Age of Onset  . Asthma Other   . Hypertension Other   . Heart disease Other   . Diabetes Other       Current Outpatient Medications on File Prior to Encounter  Medication Sig Dispense Refill  . aspirin EC 81 MG tablet Take 81 mg by mouth daily.    . Calcium Carbonate-Vit D-Min (CALCIUM 1200 PO) Take 1 tablet by mouth daily.    . cholecalciferol (VITAMIN D) 25 MCG (1000 UT) tablet Take 1,000 Units by mouth daily.    . citalopram (CELEXA) 20 MG tablet Take 20 mg by mouth daily.    [name] Kitchen. levothyroxine (SYNTHROID) 50 MCG tablet Take 50 mcg by mouth daily before breakfast.    . lovastatin (MEVACOR) 40 MG tablet Take 40 mg by mouth daily at 6 PM.    . OLANZapine (ZYPREXA) 10 MG tablet Take 10 mg by mouth daily.    . traZODone (DESYREL) 50 MG tablet Take 50 mg by mouth at bedtime.      Physical Exam:  Constitutional: Elderly female  NAD, calm, comfortable Vitals:   08/04/18 1014 08/04/18 1016 08/04/18 1145  BP: 130/65  133/70  Pulse: (!) 54  (!) 51  Resp: 16  15  Temp: 98.2 F (36.8 C)    TempSrc: Oral    SpO2: 95%  95%  Weight:  66.2 kg   Height:  5\' 1"  (1.549 m)    Eyes: PERRL, lids and conjunctivae normal ENMT: Mucous membranes are moist. Posterior pharynx clear of any exudate or lesions. Left-sided facial droop Neck: normal, supple, no masses, no thyromegaly  Respiratory: clear to auscultation bilaterally, no wheezing, no crackles. Normal respiratory effort. No accessory muscle use.  Cardiovascular: Regular rate and rhythm, no murmurs / rubs / gallops. No extremity edema. 2+ pedal pulses. No carotid bruits.  Abdomen: no tenderness, no masses palpated. No hepatosplenomegaly. Bowel sounds positive.  Musculoskeletal: no clubbing / cyanosis. No joint deformity upper and lower extremities. Good ROM, no contractures. Normal muscle tone.  Skin: no rashes, lesions, ulcers. No induration Neurologic: CN 2-12 grossly intact. Sensation intact, DTR normal. Left-sided facial droop present with strength 4+/5 on left upper extremity.  Strength 5/5 in all other extremities.  Slurred speech. Psychiatric: Normal judgment and insight. Alert and oriented x 3. Normal mood.     Labs on Admission: I have personally reviewed following labs and imaging studies  CBC: Recent Labs  Lab 08/04/18 1109  WBC 7.1  NEUTROABS 4.7  HGB 11.4*  HCT 36.9  MCV 82.9  PLT 204   Basic Metabolic Panel: Recent Labs  Lab 08/04/18 1109  NA 144  K 3.9  CL 108  CO2 29  GLUCOSE 92  BUN 12  CREATININE 0.98  CALCIUM 9.6   GFR: Estimated Creatinine Clearance: 47.9 mL/min (by C-G formula based on SCr of 0.98 mg/dL). Liver Function Tests: Recent Labs  Lab 08/04/18 1109  AST 20  ALT 22  ALKPHOS 80  BILITOT 0.6  PROT 6.5  ALBUMIN 3.4*   No results for input(s): LIPASE, AMYLASE in the last 168 hours. No results for input(s): AMMONIA in the last 168 hours. Coagulation Profile: No results for input(s): INR, PROTIME in the last 168 hours. Cardiac Enzymes: No results for input(s): CKTOTAL, CKMB, CKMBINDEX, TROPONINI in the last 168 hours. BNP (last 3 results) No results for input(s): PROBNP in the last 8760 hours. HbA1C: No results for input(s): HGBA1C in the last 72 hours. CBG: Recent Labs  Lab 08/04/18 1017  GLUCAP 89   Lipid Profile: No results for input(s): CHOL,  HDL, LDLCALC, TRIG, CHOLHDL, LDLDIRECT in the last 72 hours. Thyroid Function Tests: No results for input(s): TSH, T4TOTAL, FREET4, T3FREE, THYROIDAB in the last 72 hours. Anemia Panel: No results for input(s): VITAMINB12, FOLATE, FERRITIN, TIBC, IRON, RETICCTPCT in the last 72 hours. Urine analysis:    Component Value Date/Time   COLORURINE YELLOW 04/24/2009 2001   APPEARANCEUR CLEAR 04/24/2009 2001   LABSPEC 1.006 04/24/2009 2001   PHURINE 5.5 04/24/2009 2001   GLUCOSEU NEGATIVE 04/24/2009 2001   HGBUR NEGATIVE 04/24/2009 2001   BILIRUBINUR NEGATIVE 04/24/2009 2001   KETONESUR NEGATIVE 04/24/2009 2001   PROTEINUR NEGATIVE 04/24/2009 2001   UROBILINOGEN 1.0 04/24/2009 2001   NITRITE NEGATIVE 04/24/2009 2001   LEUKOCYTESUR NEGATIVE 04/24/2009 2001   Sepsis Labs: No results found for this or any previous visit (from the past 240 hour(s)).   Radiological Exams on Admission: Ct Head Wo Contrast  Result Date: 08/04/2018 CLINICAL DATA:  Slurred speech for 2 days EXAM: CT HEAD WITHOUT CONTRAST TECHNIQUE: Contiguous  axial images were obtained from the base of the skull through the vertex without intravenous contrast. COMPARISON:  None. FINDINGS: Brain: No evidence of acute infarction, hemorrhage, hydrocephalus, extra-axial collection or mass lesion/mass effect. Vascular: No hyperdense vessel or unexpected calcification. Skull: No osseous abnormality. Sinuses/Orbits: Visualized paranasal sinuses are clear. Visualized mastoid sinuses are clear. Visualized orbits demonstrate no focal abnormality. Other: None IMPRESSION: No acute intracranial pathology. Electronically Signed   By: Elige KoHetal  Patel   On: 08/04/2018 10:53   Mr Brain Wo Contrast (neuro Protocol)  Result Date: 08/04/2018 CLINICAL DATA:  68 year old female with slurred speech. EXAM: MRI HEAD WITHOUT CONTRAST TECHNIQUE: Multiplanar, multiecho pulse sequences of the brain and surrounding structures were obtained without intravenous  contrast. COMPARISON:  Head CT earlier today. FINDINGS: Brain: Linear 15 millimeter area of restricted diffusion in the medial left lentiform and/or posterior limb left internal capsule (series 5, image 72). Faint associated T2 and FLAIR hyperintensity. No associated hemorrhage or mass effect. No other restricted diffusion. No midline shift, mass effect, evidence of mass lesion, ventriculomegaly, extra-axial collection or acute intracranial hemorrhage. Cervicomedullary junction and pituitary are within normal limits. Scattered and patchy cerebral white matter T2 and FLAIR hyperintensity, mostly in the periatrial regions. No cortical encephalomalacia or chronic cerebral blood products. Possible chronic lacunar infarct in the posterior right lentiform. Minor T2 heterogeneity in the pons. Negative cerebellum. Vascular: Major intracranial vascular flow voids are preserved. Skull and upper cervical spine: Negative visible cervical spine. Visualized bone marrow signal is within normal limits. Sinuses/Orbits: Questionable mild proptosis, thickening of the bilateral extraocular muscles and/or increased intraorbital fat. Paranasal sinuses and mastoids are stable and well pneumatized. Other: Grossly normal visible internal auditory structures. Scalp and face soft tissues appear negative. IMPRESSION: 1. Acute lacunar infarct in the medial left lentiform and/or internal capsule. No associated hemorrhage or mass effect. 2. Possible chronic lacune in the posterior right lentiform. Patchy cerebral white matter signal changes which might also reflect chronic small vessel disease. 3. Questionable exophthalmos, consider Thyroid Eye Disease. Electronically Signed   By: Odessa FlemingH  Hall M.D.   On: 08/04/2018 12:54    EKG: Independently reviewed.  Sinus bradycardia at 53 bpm  Assessment/Plan CVA: Patient presents with slurred speech for at least 2 days now.  On physical exam patient appears to have left-sided facial droop with strength  4+/5 on the left upper extremity.  MRI revealing a acute lacunar infarct in the medial left arm and/or internal capsule.  Neurology was consulted - Admit to telemetry bed - Stroke order set initiated - Neuro checks - PT/OT/Speech to eval and treat - Check echocardiogram and carotid Doppler ultrasound  - Check Hemoglobin A1c and lipid panel in a.m. - ASA and statin - Appreciate neurology consultative services, will follow-up for further recommendations - Social work consult  - Follow-up telemetry overnight\  Hypochromic anemia: Hemoglobin 11.4 g/dL with low MCV and elevated RDW.  Denies any reports of blood in stool - Check iron studies in a.m.  Schizoaffective disorder -Continue Celexa and olanzapine  Hypothyroidism - Check TSH  - Continue levothyroxine  Hyperlipidemia: At home patient on lovastatin 40 mg daily -Changed to atorvastatin 80 mg daily  Tobacco abuse: Patient reports smoking 1 pack of cigarettes per Day on average for over 20 years, but reported quitting 1 to 2 months ago. -Encouraged continued cessation of tobacco use  DVT prophylaxis: Lovenox Code Status: Full Family Communication: No family present at bedside Disposition Plan: To be determined Consults called: Neurology Admission status: Inpatient    Charmayne Sheer MD [facility] Pager [phone]   If 7PM-7AM, please contact night-coverage [email or web address] Password [email or web address]  08/04/2018, 1:35 PM

## 2018-08-04 NOTE — ED Provider Notes (Signed)
[facility] Provider Note   CSN: [id number] Arrival date & time: 08/04/18  1006     History   Chief Complaint Chief Complaint  Patient presents with   Aphasia    HPI [name] Day is a 68 y.o. female.     HPI Patient presents to the emergency department with a 2-day history of slurred speech and difficulty with her words.  The patient states that she is also felt to weakness that her legs.  Patient is pretty vague on which side she feels is affected.  Patient states that nothing seems to make the condition better or worse.  She states that her granddaughter noticed she was speaking funny yesterday so she decided to come to the hospital today.  Patient states that she did not take any medications prior to arrival for symptoms.  The patient denies chest pain, shortness of breath, headache,blurred vision, neck pain, fever, cough, weakness, numbness, dizziness, anorexia, edema, abdominal pain, nausea, vomiting, diarrhea, rash, back pain, dysuria, hematemesis, bloody stool, near syncope, or syncope. Past Medical History:  Diagnosis Date   Hyperlipidemia    Hypothyroidism    Schizoaffective disorder (HCC)    Tobacco abuse     Patient Active Problem List   Diagnosis Date Noted   CVA (cerebral vascular accident) (HCC) 08/04/2018    History reviewed. No pertinent surgical history.   OB History   No obstetric history on file.      Home Medications    Prior to Admission medications   Medication Sig Start Date End Date Taking? Authorizing Provider  aspirin EC 81 MG tablet Take 81 mg by mouth daily.   Yes [provider]  Calcium Carbonate-Vit D-Min (CALCIUM 1200 PO) Take 1 tablet by mouth daily.   Yes [provider]  cholecalciferol (VITAMIN D) 25 MCG (1000 UT) tablet Take 1,000 Units by mouth daily.   Yes [provider]  citalopram (CELEXA) 20 MG tablet Take 20 mg by mouth daily.   Yes [provider]  levothyroxine (SYNTHROID) 50 MCG tablet Take 50 mcg by mouth daily before breakfast.   Yes [provider]  lovastatin (MEVACOR) 40 MG tablet Take 40 mg by mouth daily at 6 PM. 05/17/16  Yes [provider]  OLANZapine (ZYPREXA) 10 MG tablet Take 10 mg by mouth daily. 05/30/16  Yes [provider]  traZODone (DESYREL) 50 MG tablet Take 50 mg by mouth at bedtime.   Yes [provider]    Family History Family History  Problem Relation Age of Onset   Asthma Other    Hypertension Other    Heart disease Other    Diabetes Other     Social History Social History   Tobacco Use   Smoking status: Never Smoker   Smokeless tobacco: Never Used  Substance Use Topics   Alcohol use: Never    Frequency: Never   Drug use: Not on file     Allergies   Patient has no known allergies.   Review of Systems Review of Systems  All other systems negative except as documented in the HPI. All pertinent positives and negatives as reviewed in the HPI. Physical Exam Updated Vital Signs BP 135/71    Pulse (!) 51    Temp 98.2 F (36.8 C) (Oral)    Resp 17    Ht 5\' 1"  (1.549 m)    Wt 66.2 kg    SpO2 95%    BMI 27.59 kg/m  Physical Exam Vitals signs and nursing note reviewed.  Constitutional:      General: She is not in acute distress.    Appearance: She is well-developed.  HENT:     Head: Normocephalic and atraumatic.  Eyes:     Pupils: Pupils are equal, round, and reactive to light.  Neck:     Musculoskeletal: Normal range of motion and neck supple.  Cardiovascular:     Rate and Rhythm: Normal rate and regular rhythm.     Heart sounds: Normal heart sounds. No murmur. No friction rub. No gallop.   Pulmonary:     Effort: Pulmonary effort is normal. No respiratory distress.     Breath sounds: Normal breath sounds. No wheezing.  Abdominal:     General: Bowel sounds are normal. There is no distension.     Palpations: Abdomen is soft.       Tenderness: There is no abdominal tenderness.  Skin:    General: Skin is warm and dry.     Capillary Refill: Capillary refill takes less than 2 seconds.     Findings: No erythema or rash.  Neurological:     Mental Status: She is alert and oriented to person, place, and time.     Sensory: No sensory deficit.     Motor: No weakness or abnormal muscle tone.     Coordination: Coordination normal.     Comments: Does appear to have slurred speech on my examination.  He does have some trouble with getting out her words as well.  Psychiatric:        Behavior: Behavior normal.      ED Treatments / Results  Labs (all labs ordered are listed, but only abnormal results are displayed) Labs Reviewed  COMPREHENSIVE METABOLIC PANEL - Abnormal; Notable for the following components:      Result Value   Albumin 3.4 (*)    GFR calc non Af Amer 59 (*)    All other components within normal limits  CBC WITH DIFFERENTIAL/PLATELET - Abnormal; Notable for the following components:   Hemoglobin 11.4 (*)    MCH 25.6 (*)    RDW 16.2 (*)    All other components within normal limits  SARS CORONAVIRUS 2 (HOSPITAL ORDER, PERFORMED IN [facility])  URINALYSIS, ROUTINE W REFLEX MICROSCOPIC  HIV ANTIBODY (ROUTINE TESTING W REFLEX)  TSH  CBG MONITORING, ED    EKG EKG Interpretation  Date/Time:  Friday August 04 2018 10:15:32 EDT Ventricular Rate:  53 PR Interval:    QRS Duration: 118 QT Interval:  474 QTC Calculation: 445 R Axis:   23 Text Interpretation:  Sinus rhythm Incomplete right bundle branch block Aflutter is no longer present compared to 2008 Confirmed by Pricilla LovelessGoldston, Scott [phone]([id number]) on 08/04/2018 10:58:22 AM   Radiology Ct Head Wo Contrast  Result Date: 08/04/2018 CLINICAL DATA:  Slurred speech for 2 days EXAM: CT HEAD WITHOUT CONTRAST TECHNIQUE: Contiguous axial images were obtained from the base of the skull through the vertex without intravenous contrast. COMPARISON:  None.  FINDINGS: Brain: No evidence of acute infarction, hemorrhage, hydrocephalus, extra-axial collection or mass lesion/mass effect. Vascular: No hyperdense vessel or unexpected calcification. Skull: No osseous abnormality. Sinuses/Orbits: Visualized paranasal sinuses are clear. Visualized mastoid sinuses are clear. Visualized orbits demonstrate no focal abnormality. Other: None IMPRESSION: No acute intracranial pathology. Electronically Signed   By: Elige KoHetal  Patel   On: 08/04/2018 10:53   Mr Brain Wo Contrast (neuro Protocol)  Result Date: 08/04/2018 CLINICAL DATA:  68 year old  female with slurred speech. EXAM: MRI HEAD WITHOUT CONTRAST TECHNIQUE: Multiplanar, multiecho pulse sequences of the brain and surrounding structures were obtained without intravenous contrast. COMPARISON:  Head CT earlier today. FINDINGS: Brain: Linear 15 millimeter area of restricted diffusion in the medial left lentiform and/or posterior limb left internal capsule (series 5, image 72). Faint associated T2 and FLAIR hyperintensity. No associated hemorrhage or mass effect. No other restricted diffusion. No midline shift, mass effect, evidence of mass lesion, ventriculomegaly, extra-axial collection or acute intracranial hemorrhage. Cervicomedullary junction and pituitary are within normal limits. Scattered and patchy cerebral white matter T2 and FLAIR hyperintensity, mostly in the periatrial regions. No cortical encephalomalacia or chronic cerebral blood products. Possible chronic lacunar infarct in the posterior right lentiform. Minor T2 heterogeneity in the pons. Negative cerebellum. Vascular: Major intracranial vascular flow voids are preserved. Skull and upper cervical spine: Negative visible cervical spine. Visualized bone marrow signal is within normal limits. Sinuses/Orbits: Questionable mild proptosis, thickening of the bilateral extraocular muscles and/or increased intraorbital fat. Paranasal sinuses and mastoids are stable and well  pneumatized. Other: Grossly normal visible internal auditory structures. Scalp and face soft tissues appear negative. IMPRESSION: 1. Acute lacunar infarct in the medial left lentiform and/or internal capsule. No associated hemorrhage or mass effect. 2. Possible chronic lacune in the posterior right lentiform. Patchy cerebral white matter signal changes which might also reflect chronic small vessel disease. 3. Questionable exophthalmos, consider Thyroid Eye Disease. Electronically Signed   By: Genevie Ann M.D.   On: 08/04/2018 12:54    Procedures Procedures (including critical care time)  Medications Ordered in ED Medications   stroke: mapping our early stages of recovery book (has no administration in time range)  acetaminophen (TYLENOL) tablet 650 mg (has no administration in time range)    Or  acetaminophen (TYLENOL) solution 650 mg (has no administration in time range)    Or  acetaminophen (TYLENOL) suppository 650 mg (has no administration in time range)  senna-docusate (Senokot-S) tablet 1 tablet (has no administration in time range)  enoxaparin (LOVENOX) injection 40 mg (has no administration in time range)  atorvastatin (LIPITOR) tablet 80 mg (has no administration in time range)  aspirin tablet 325 mg (has no administration in time range)  aspirin EC tablet 81 mg (has no administration in time range)  citalopram (CELEXA) tablet 20 mg (has no administration in time range)  OLANZapine (ZYPREXA) tablet 10 mg (has no administration in time range)  traZODone (DESYREL) tablet 50 mg (has no administration in time range)  levothyroxine (SYNTHROID) tablet 50 mcg (has no administration in time range)     Initial Impression / Assessment and Plan / ED Course  I have reviewed the triage vital signs and the nursing notes.  Pertinent labs & imaging results that were available during my care of the patient were reviewed by me and considered in my medical decision making (see chart for details).          I spoke with Dr. Lorraine Lax about the patient and in the middle of her conversation he states that he had to take another phone call.  I did not receive a call back from him.  I did speak with the [facility] who will admit the patient for further evaluation and care.  There was some difficulty in getting the page out to Dr.Aroor and getting a call back.  Patient will need further evaluation and care for an acute stroke.  Final Clinical Impressions(s) / ED Diagnoses   Final diagnoses:  None    ED Discharge Orders    None       Charlestine NightLawyer, Izzie Geers, Cordelia Poche-C 08/05/18 1450    Pricilla LovelessGoldston, Scott, MD 08/08/18 0730

## 2018-08-04 NOTE — Progress Notes (Signed)
  Speech Language Pathology Treatment: Cognitive-Linquistic(Dysarthria )  Patient Details Name: [name] Day MRN: [id number] DOB: 1950-12-16 Today's Date: 08/04/2018 Time: 9937-1696 SLP Time Calculation (min) (ACUTE ONLY): 14 min  Assessment / Plan / Recommendation Clinical Impression  Pt was seen for dysarthria treatment and was cooperative during the session. She was educated regarding the nature of dysarthria and compensatory strategies to improve speech intelligibility. A dysarthria handout was provided to facilitate education and the pt verbalized understanding regarding all areas of education. She used compensatory strategies at the phrase level with 46% accuracy increasing to 100% accuracy with mod cues for use of all strategies. She required min-mod cues at the word level. SLP will continue to follow pt.      HPI HPI: Pt is a 68 y.o. female with medical history significant of HLD, schizoaffective disorder, and hypothyroidism; who presented with complaints of at least two days of slurred speech. 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  Possible chronic lacune in the posterior right lentiform. Patchy cerebral white matter signal changes which might also reflect chronic small vessel disease.      SLP Plan  Continue with current plan of care  Patient needs continued Speech Lanaguage Pathology Services    Recommendations                   Follow up Recommendations: Other (comment)(TBD based on her progress) SLP Visit Diagnosis: Dysarthria and anarthria (R47.1) Plan: Continue with current plan of care       Joelly Bolanos I. [name], [place], [place] Office number [phone] Pager [place] 08/04/2018, 4:12 PM

## 2018-08-04 NOTE — ED Triage Notes (Signed)
Pt arrives from home. Complaint of slurred speech that began a couple days ago. Pt a&ox4. VSS.

## 2018-08-04 NOTE — ED Notes (Addendum)
ED TO INPATIENT HANDOFF REPORT  ED Nurse Name and Phone #: Alycia RossettiRyan, [phone]  S Name/Age/Gender [name] Day 68 y.o. female Room/Bed: 025C/025C  Code Status   Code Status: Not on file  Home/SNF/Other Home Patient oriented to: self, place, time and situation Is this baseline? Yes   Triage Complete: Triage complete  Chief Complaint sick  Triage Note Pt arrives from home. Complaint of slurred speech that began a couple days ago. Pt a&ox4. VSS.   Allergies No Known Allergies  Level of Care/Admitting Diagnosis ED Disposition    ED Disposition Condition Comment   Admit  [facility]ospital [facility]: [facility] [[id number]]  Level of Care: Telemetry Medical [104]  Covid Evaluation: Screening Protocol (No Symptoms)  Diagnosis: CVA (cerebral vascular accident) [facility](HCC) [[id number]][[id number]]  Admitting Physician: Clydie BraunSMITH, RONDELL A [[id number]]  Attending Physician: Clydie BraunSMITH, RONDELL A [[id number]]  Estimated length of stay: past midnight tomorrow  Certification:: I certify this patient will need inpatient services for at least 2 midnights  PT Class (Do Not Modify): Inpatient [101]  PT Acc Code (Do Not Modify): Private [1]       B Medical/Surgery History Past Medical History:  Diagnosis Date  . Hyperlipidemia    History reviewed. No pertinent surgical history.   A IV Location/Drains/Wounds Patient Lines/Drains/Airways Status   Active Line/Drains/Airways    Name:   Placement date:   Placement time:   Site:   Days:   Peripheral IV 08/04/18 Right Antecubital   08/04/18    1137    Antecubital   less than 1          Intake/Output Last 24 hours No intake or output data in the 24 hours ending 08/04/18 1353  Labs/Imaging Results for orders placed or performed during the hospital encounter of 08/04/18 (from the past 48 hour(s))  CBG monitoring, ED     Status: None   Collection Time: 08/04/18 10:17 AM  Result Value Ref Range   Glucose-Capillary 89 70 - 99 mg/dL   Comment 1 Notify RN     Comment 2 Document in Chart   Comprehensive metabolic panel     Status: Abnormal   Collection Time: 08/04/18 11:09 AM  Result Value Ref Range   Sodium 144 135 - 145 mmol/L   Potassium 3.9 3.5 - 5.1 mmol/L   Chloride 108 98 - 111 mmol/L   CO2 29 22 - 32 mmol/L   Glucose, Bld 92 70 - 99 mg/dL   BUN 12 8 - 23 mg/dL   Creatinine, Ser 0.860.98 0.44 - 1.00 mg/dL   Calcium 9.6 8.9 - 57.810.3 mg/dL   Total Protein 6.5 6.5 - 8.1 g/dL   Albumin 3.4 (L) 3.5 - 5.0 g/dL   AST 20 15 - 41 U/L   ALT 22 0 - 44 U/L   Alkaline Phosphatase 80 38 - 126 U/L   Total Bilirubin 0.6 0.3 - 1.2 mg/dL   GFR calc non Af Amer 59 (L) >60 mL/min   GFR calc Af Amer >60 >60 mL/min   Anion gap 7 5 - 15    Comment: Performed at [facility], [address] [place], [place] [address]  CBC with Differential     Status: Abnormal   Collection Time: 08/04/18 11:09 AM  Result Value Ref Range   WBC 7.1 4.0 - 10.5 K/uL   RBC 4.45 3.87 - 5.11 MIL/uL   Hemoglobin 11.4 (L) 12.0 - 15.0 g/dL   HCT 95.236.9 84.136.0 - 32.446.0 %   MCV  82.9 80.0 - 100.0 fL   MCH 25.6 (L) 26.0 - 34.0 pg   MCHC 30.9 30.0 - 36.0 g/dL   RDW 16.116.2 (H) 09.611.5 - 04.515.5 %   Platelets 204 150 - 400 K/uL   nRBC 0.0 0.0 - 0.2 %   Neutrophils Relative % 66 %   Neutro Abs 4.7 1.7 - 7.7 K/uL   Lymphocytes Relative 24 %   Lymphs Abs 1.7 0.7 - 4.0 K/uL   Monocytes Relative 8 %   Monocytes Absolute 0.6 0.1 - 1.0 K/uL   Eosinophils Relative 2 %   Eosinophils Absolute 0.1 0.0 - 0.5 K/uL   Basophils Relative 0 %   Basophils Absolute 0.0 0.0 - 0.1 K/uL   Immature Granulocytes 0 %   Abs Immature Granulocytes 0.02 0.00 - 0.07 K/uL    Comment: Performed at [facility], [address] [place], [place] [address]   Ct Head Wo Contrast  Result Date: 08/04/2018 CLINICAL DATA:  Slurred speech for 2 days EXAM: CT HEAD WITHOUT CONTRAST TECHNIQUE: Contiguous axial images were obtained from the base of the skull through the vertex without intravenous contrast. COMPARISON:   None. FINDINGS: Brain: No evidence of acute infarction, hemorrhage, hydrocephalus, extra-axial collection or mass lesion/mass effect. Vascular: No hyperdense vessel or unexpected calcification. Skull: No osseous abnormality. Sinuses/Orbits: Visualized paranasal sinuses are clear. Visualized mastoid sinuses are clear. Visualized orbits demonstrate no focal abnormality. Other: None IMPRESSION: No acute intracranial pathology. Electronically Signed   By: Elige KoHetal  Patel   On: 08/04/2018 10:53   Mr Brain Wo Contrast (neuro Protocol)  Result Date: 08/04/2018 CLINICAL DATA:  68 year old female with slurred speech. EXAM: MRI HEAD WITHOUT CONTRAST TECHNIQUE: Multiplanar, multiecho pulse sequences of the brain and surrounding structures were obtained without intravenous contrast. COMPARISON:  Head CT earlier today. FINDINGS: Brain: Linear 15 millimeter area of restricted diffusion in the medial left lentiform and/or posterior limb left internal capsule (series 5, image 72). Faint associated T2 and FLAIR hyperintensity. No associated hemorrhage or mass effect. No other restricted diffusion. No midline shift, mass effect, evidence of mass lesion, ventriculomegaly, extra-axial collection or acute intracranial hemorrhage. Cervicomedullary junction and pituitary are within normal limits. Scattered and patchy cerebral white matter T2 and FLAIR hyperintensity, mostly in the periatrial regions. No cortical encephalomalacia or chronic cerebral blood products. Possible chronic lacunar infarct in the posterior right lentiform. Minor T2 heterogeneity in the pons. Negative cerebellum. Vascular: Major intracranial vascular flow voids are preserved. Skull and upper cervical spine: Negative visible cervical spine. Visualized bone marrow signal is within normal limits. Sinuses/Orbits: Questionable mild proptosis, thickening of the bilateral extraocular muscles and/or increased intraorbital fat. Paranasal sinuses and mastoids are stable and  well pneumatized. Other: Grossly normal visible internal auditory structures. Scalp and face soft tissues appear negative. IMPRESSION: 1. Acute lacunar infarct in the medial left lentiform and/or internal capsule. No associated hemorrhage or mass effect. 2. Possible chronic lacune in the posterior right lentiform. Patchy cerebral white matter signal changes which might also reflect chronic small vessel disease. 3. Questionable exophthalmos, consider Thyroid Eye Disease. Electronically Signed   By: Odessa FlemingH  Hall M.D.   On: 08/04/2018 12:54    Pending Labs Unresulted Labs (From admission, onward)    Start     Ordered   08/04/18 1321  SARS Coronavirus 2 (CEPHEID - Performed in [facility] hospital lab), Hosp Order  (Asymptomatic Patients Labs)  Once,   STAT    Question:  Rule Out  Answer:  Yes  08/04/18 1320   08/04/18 1034  Urinalysis, Routine w reflex microscopic  ONCE - STAT,   STAT     08/04/18 1033          Vitals/Pain Today's Vitals   08/04/18 1014 08/04/18 1016 08/04/18 1022 08/04/18 1145  BP: 130/65   133/70  Pulse: (!) 54   (!) 51  Resp: 16   15  Temp: 98.2 F (36.8 C)     TempSrc: Oral     SpO2: 95%   95%  Weight:  66.2 kg    Height:  5\' 1"  (1.549 m)    PainSc:  0-No pain 0-No pain     Isolation Precautions No active isolations  Medications Medications - No data to display  Mobility walks High fall risk   Focused Assessments    R Recommendations: See Admitting Provider Note  Report given to: Kasandra Knudsen, RN  Additional Notes:

## 2018-08-05 ENCOUNTER — Encounter (HOSPITAL_COMMUNITY): Payer: Self-pay | Admitting: Radiology

## 2018-08-05 ENCOUNTER — Inpatient Hospital Stay (HOSPITAL_COMMUNITY): Payer: Medicare Other

## 2018-08-05 DIAGNOSIS — I371 Nonrheumatic pulmonary valve insufficiency: Secondary | ICD-10-CM

## 2018-08-05 DIAGNOSIS — E039 Hypothyroidism, unspecified: Secondary | ICD-10-CM

## 2018-08-05 DIAGNOSIS — I639 Cerebral infarction, unspecified: Secondary | ICD-10-CM

## 2018-08-05 LAB — RAPID URINE DRUG SCREEN, HOSP PERFORMED
Amphetamines: NOT DETECTED
Barbiturates: NOT DETECTED
Benzodiazepines: NOT DETECTED
Cocaine: NOT DETECTED
Opiates: NOT DETECTED
Tetrahydrocannabinol: NOT DETECTED

## 2018-08-05 LAB — BASIC METABOLIC PANEL
Anion gap: 11 (ref 5–15)
BUN: 12 mg/dL (ref 8–23)
CO2: 27 mmol/L (ref 22–32)
Calcium: 9.4 mg/dL (ref 8.9–10.3)
Chloride: 103 mmol/L (ref 98–111)
Creatinine, Ser: 0.82 mg/dL (ref 0.44–1.00)
GFR calc Af Amer: >60 mL/min (ref >60)
GFR calc non Af Amer: >60 mL/min (ref >60)
Glucose, Bld: 87 mg/dL (ref 70–99)
Potassium: 3.5 mmol/L (ref 3.5–5.1)
Sodium: 141 mmol/L (ref 135–145)

## 2018-08-05 LAB — LIPID PANEL
Cholesterol: 180 mg/dL (ref 0–200)
HDL: 81 mg/dL (ref >40)
LDL Cholesterol: 90 mg/dL (ref 0–99)
Total CHOL/HDL Ratio: 2.2 RATIO
Triglycerides: 44 mg/dL (ref <150)
VLDL: 9 mg/dL (ref 0–40)

## 2018-08-05 LAB — CBC WITH DIFFERENTIAL/PLATELET
Abs Immature Granulocytes: 0.03 10*3/uL (ref 0.00–0.07)
Basophils Absolute: 0 10*3/uL (ref 0.0–0.1)
Basophils Relative: 0 %
Eosinophils Absolute: 0.2 10*3/uL (ref 0.0–0.5)
Eosinophils Relative: 3 %
HCT: 38 % (ref 36.0–46.0)
Hemoglobin: 11.9 g/dL — ABNORMAL LOW (ref 12.0–15.0)
Immature Granulocytes: 0 %
Lymphocytes Relative: 31 %
Lymphs Abs: 2.1 10*3/uL (ref 0.7–4.0)
MCH: 25.6 pg — ABNORMAL LOW (ref 26.0–34.0)
MCHC: 31.3 g/dL (ref 30.0–36.0)
MCV: 81.9 fL (ref 80.0–100.0)
Monocytes Absolute: 0.5 10*3/uL (ref 0.1–1.0)
Monocytes Relative: 8 %
Neutro Abs: 3.9 10*3/uL (ref 1.7–7.7)
Neutrophils Relative %: 58 %
Platelets: 185 10*3/uL (ref 150–400)
RBC: 4.64 MIL/uL (ref 3.87–5.11)
RDW: 16.1 % — ABNORMAL HIGH (ref 11.5–15.5)
WBC: 6.7 10*3/uL (ref 4.0–10.5)
nRBC: 0 % (ref 0.0–0.2)

## 2018-08-05 LAB — IRON AND TIBC
Iron: 61 ug/dL (ref 28–170)
Saturation Ratios: 24 % (ref 10.4–31.8)
TIBC: 259 ug/dL (ref 250–450)
UIBC: 198 ug/dL

## 2018-08-05 LAB — ECHOCARDIOGRAM COMPLETE
Height: 61 in
Weight: 2336 oz

## 2018-08-05 LAB — HEMOGLOBIN A1C
Hgb A1c MFr Bld: 6.2 % — ABNORMAL HIGH (ref 4.8–5.6)
Mean Plasma Glucose: 131.24 mg/dL

## 2018-08-05 LAB — FERRITIN: Ferritin: 73 ng/mL (ref 11–307)

## 2018-08-05 LAB — HIV ANTIBODY (ROUTINE TESTING W REFLEX): HIV Screen 4th Generation wRfx: NONREACTIVE

## 2018-08-05 MED ORDER — ATORVASTATIN CALCIUM 80 MG PO TABS: 80.0000 mg | ORAL_TABLET | Freq: Every day | ORAL | 1 refills | Status: AC

## 2018-08-05 MED ORDER — ATORVASTATIN CALCIUM 10 MG PO TABS
20.0000 mg | ORAL_TABLET | Freq: Every day | ORAL | Status: DC
  Administered 2018-08-05 – 2018-08-09 (×5): 20 mg via ORAL
  Filled 2018-08-05 (×5): qty 2

## 2018-08-05 MED ORDER — CLOPIDOGREL BISULFATE 75 MG PO TABS
75.0000 mg | ORAL_TABLET | Freq: Every day | ORAL | Status: DC
  Administered 2018-08-05 – 2018-08-10 (×6): 75 mg via ORAL
  Filled 2018-08-05 (×6): qty 1

## 2018-08-05 MED ORDER — SODIUM CHLORIDE 0.9% FLUSH: 10.0000 mL | INTRAVENOUS | Status: DC | PRN

## 2018-08-05 MED ORDER — IOHEXOL 350 MG/ML SOLN
50.0000 mL | Freq: Once | INTRAVENOUS | Status: AC | PRN
  Administered 2018-08-05: 50 mL via INTRAVENOUS

## 2018-08-05 NOTE — Progress Notes (Signed)
PROGRESS NOTE    [name] McalpineBrenda S Day  ZOX:[id number] DOB: 1950/11/19 DOA: 08/04/2018 PCP: Mila PalmerWolters, Sharon, MD   Brief Narrative: 68 y.o. right-hand-dominant female with medical history significant of HLD, schizoaffective disorder, and hypothyroidism; who presents with complaints of at least 2 days of slurred speech.  History mostly obtained from the patient from home history is somewhat limited due to speech.  Notes of some possible weakness, but does not necessarily localize it to 1 specific side.  She reports previous injury with a Q-tip causing some discoloration of the right side of her face.  Associated symptoms include some shortness of breath.  Patient reports smoking for at least 20 some years approximately 1 pack a day on average but quit in 1 to 2 months ago.  Denies having any palpitations, chest pain, nausea, vomiting, cough, dysuria, or recent falls.  She is on daily aspirin.  ED Course: Upon admission into the emergency department patient was noted to be afebrile, pulse 51-54, and all other vital signs maintained.  Labs revealed hemoglobin 11.4, and labs otherwise were noted to be within normal limits.  MRI of the brain revealed acute  medial left lentiform and/or internal capsule infarcts.  Neurology was consulted by the ED provider.  TRH called to admit  Assessment & Plan:   Principal Problem:   CVA (cerebral vascular accident) [facility](HCC) Active Problems:   Tobacco abuse   Hypothyroidism   Schizoaffective disorder (HCC)   Hyperlipidemia   acute left lacunar infarct-patient admitted with slurred speech and dysarthria.  MRI shows acute lacunar infarct in the medial left lentiform and/or internal capsule.  Possible chronic lacunar in the posterior right lentiform.  Work-up shows LDL of 90, hemoglobin A1c of 6.2, TSH 3.2 echo done results pending,CUS no blockage.PT recomends CIR.will consult.cont asa statin  Schizoaffective disorder -Continue Celexa and olanzapine  Hypothyroidism -  Check TSH  - Continue levothyroxine  Hyperlipidemia: At home patient on lovastatin 40 mg daily -Changed to atorvastatin 80 mg daily  Tobacco abuse: Patient reports smoking 1 pack of cigarettes per day on average for over 20 years, but reported quitting 1 to 2 months ago. -Encouraged continued cessation of tobacco use  DVT prophylaxis: Lovenox Code Status: Full Family Communication: No family present at bedside Disposition Plan:CIR  Consults called: Neurology   Estimated body mass index is 27.59 kg/m as calculated from the following:   Height as of this encounter: 5\' 1"  (1.549 m).   Weight as of this encounter: 66.2 kg.     Subjective: Resting in bed awake alert   Objective: Vitals:   08/04/18 2230 08/05/18 0004 08/05/18 0436 08/05/18 0808  BP: 128/78 131/77 (!) 125/56 108/75  Pulse: (!) 49 (!) 47 (!) 47 (!) 54  Resp: 18 17 16 17   Temp: 98.6 F (37 C) 98.7 F (37.1 C) 97.9 F (36.6 C) 98.4 F (36.9 C)  TempSrc: Oral Oral Oral Oral  SpO2: 99% 98% 98% 97%  Weight:      Height:        Intake/Output Summary (Last 24 hours) at 08/05/2018 1105 Last data filed at 08/05/2018 0803 Gross per 24 hour  Intake 240 ml  Output 150 ml  Net 90 ml   Filed Weights   08/04/18 1016  Weight: 66.2 kg    Examination:  General exam: Appears calm and comfortable  Respiratory system: Clear to auscultation. Respiratory effort normal. Cardiovascular system: S1 & S2 heard, RRR. No JVD, murmurs, rubs, gallops or clicks. No pedal edema. Gastrointestinal system: Abdomen  is nondistended, soft and nontender. No organomegaly or masses felt. Normal bowel sounds heard. Central nervous system: awake Alert and oriented. Dysarthric moves all ext Extremities: Symmetric 5 x 5 power. Skin: No rashes, lesions or ulcers Psychiatry: Judgement and insight appear normal. Mood & affect appropriate.     Data Reviewed: I have personally reviewed following labs and imaging studies  CBC: Recent  Labs  Lab 08/04/18 1109 08/05/18 0353  WBC 7.1 6.7  NEUTROABS 4.7 3.9  HGB 11.4* 11.9*  HCT 36.9 38.0  MCV 82.9 81.9  PLT 204 185   Basic Metabolic Panel: Recent Labs  Lab 08/04/18 1109 08/05/18 0353  NA 144 141  K 3.9 3.5  CL 108 103  CO2 29 27  GLUCOSE 92 87  BUN 12 12  CREATININE 0.98 0.82  CALCIUM 9.6 9.4   GFR: Estimated Creatinine Clearance: 57.2 mL/min (by C-G formula based on SCr of 0.82 mg/dL). Liver Function Tests: Recent Labs  Lab 08/04/18 1109  AST 20  ALT 22  ALKPHOS 80  BILITOT 0.6  PROT 6.5  ALBUMIN 3.4*   No results for input(s): LIPASE, AMYLASE in the last 168 hours. No results for input(s): AMMONIA in the last 168 hours. Coagulation Profile: No results for input(s): INR, PROTIME in the last 168 hours. Cardiac Enzymes: No results for input(s): CKTOTAL, CKMB, CKMBINDEX, TROPONINI in the last 168 hours. BNP (last 3 results) No results for input(s): PROBNP in the last 8760 hours. HbA1C: Recent Labs    08/05/18 0353  HGBA1C 6.2*   CBG: Recent Labs  Lab 08/04/18 1017  GLUCAP 89   Lipid Profile: Recent Labs    08/05/18 0353  CHOL 180  HDL 81  LDLCALC 90  TRIG 44  CHOLHDL 2.2   Thyroid Function Tests: Recent Labs    08/04/18 1452  TSH 3.276   Anemia Panel: Recent Labs    08/05/18 0353  FERRITIN 73  TIBC 259  IRON 61   Sepsis Labs: No results for input(s): PROCALCITON, LATICACIDVEN in the last 168 hours.  Recent Results (from the past 240 hour(s))  SARS Coronavirus 2 (CEPHEID - Performed in [facility] hospital lab), Hosp Order     Status: None   Collection Time: 08/04/18  1:50 PM   Specimen: Nasopharyngeal Swab  Result Value Ref Range Status   SARS Coronavirus 2 NEGATIVE NEGATIVE Final    Comment: (NOTE) If result is NEGATIVE SARS-CoV-2 target nucleic acids are NOT DETECTED. The SARS-CoV-2 RNA is generally detectable in upper and lower  respiratory specimens during the acute phase of infection. The lowest    concentration of SARS-CoV-2 viral copies this assay can detect is 250  copies / mL. A negative result does not preclude SARS-CoV-2 infection  and should not be used as the sole basis for treatment or other  patient management decisions.  A negative result may occur with  improper specimen collection / handling, submission of specimen other  than nasopharyngeal swab, presence of viral mutation(s) within the  areas targeted by this assay, and inadequate number of viral copies  (<250 copies / mL). A negative result must be combined with clinical  observations, patient history, and epidemiological information. If result is POSITIVE SARS-CoV-2 target nucleic acids are DETECTED. The SARS-CoV-2 RNA is generally detectable in upper and lower  respiratory specimens dur ing the acute phase of infection.  Positive  results are indicative of active infection with SARS-CoV-2.  Clinical  correlation with patient history and other diagnostic information is  necessary  to determine patient infection status.  Positive results do  not rule out bacterial infection or co-infection with other viruses. If result is PRESUMPTIVE POSTIVE SARS-CoV-2 nucleic acids MAY BE PRESENT.   A presumptive positive result was obtained on the submitted specimen  and confirmed on repeat testing.  While 2019 novel coronavirus  (SARS-CoV-2) nucleic acids may be present in the submitted sample  additional confirmatory testing may be necessary for epidemiological  and / or clinical management purposes  to differentiate between  SARS-CoV-2 and other Sarbecovirus currently known to infect humans.  If clinically indicated additional testing with an alternate test  methodology [phone]([id number]) is advised. The SARS-CoV-2 RNA is generally  detectable in upper and lower respiratory sp ecimens during the acute  phase of infection. The expected result is Negative. Fact Sheet for Patients:  BoilerBrush.com.cyhttps://www.fda.gov/media/136312/download Fact Sheet  for Healthcare Providers: https://pope.com/https://www.fda.gov/media/136313/download This test is not yet approved or cleared by the [place] FDA and has been authorized for detection and/or diagnosis of SARS-CoV-2 by FDA under an Emergency Use Authorization (EUA).  This EUA will remain in effect (meaning this test can be used) for the duration of the COVID-19 declaration under Section 564(b)(1) of the Act, 21 U.S.C. section 360bbb-3(b)(1), unless the authorization is terminated or revoked sooner. Performed at [facility], [address] [place], [place] [address]          Radiology Studies: Ct Head Wo Contrast  Result Date: 08/04/2018 CLINICAL DATA:  Slurred speech for 2 days EXAM: CT HEAD WITHOUT CONTRAST TECHNIQUE: Contiguous axial images were obtained from the base of the skull through the vertex without intravenous contrast. COMPARISON:  None. FINDINGS: Brain: No evidence of acute infarction, hemorrhage, hydrocephalus, extra-axial collection or mass lesion/mass effect. Vascular: No hyperdense vessel or unexpected calcification. Skull: No osseous abnormality. Sinuses/Orbits: Visualized paranasal sinuses are clear. Visualized mastoid sinuses are clear. Visualized orbits demonstrate no focal abnormality. Other: None IMPRESSION: No acute intracranial pathology. Electronically Signed   By: Elige KoHetal  Patel   On: 08/04/2018 10:53   Mr Brain Wo Contrast (neuro Protocol)  Result Date: 08/04/2018 CLINICAL DATA:  68 year old female with slurred speech. EXAM: MRI HEAD WITHOUT CONTRAST TECHNIQUE: Multiplanar, multiecho pulse sequences of the brain and surrounding structures were obtained without intravenous contrast. COMPARISON:  Head CT earlier today. FINDINGS: Brain: Linear 15 millimeter area of restricted diffusion in the medial left lentiform and/or posterior limb left internal capsule (series 5, image 72). Faint associated T2 and FLAIR hyperintensity. No associated hemorrhage or mass effect. No other  restricted diffusion. No midline shift, mass effect, evidence of mass lesion, ventriculomegaly, extra-axial collection or acute intracranial hemorrhage. Cervicomedullary junction and pituitary are within normal limits. Scattered and patchy cerebral white matter T2 and FLAIR hyperintensity, mostly in the periatrial regions. No cortical encephalomalacia or chronic cerebral blood products. Possible chronic lacunar infarct in the posterior right lentiform. Minor T2 heterogeneity in the pons. Negative cerebellum. Vascular: Major intracranial vascular flow voids are preserved. Skull and upper cervical spine: Negative visible cervical spine. Visualized bone marrow signal is within normal limits. Sinuses/Orbits: Questionable mild proptosis, thickening of the bilateral extraocular muscles and/or increased intraorbital fat. Paranasal sinuses and mastoids are stable and well pneumatized. Other: Grossly normal visible internal auditory structures. Scalp and face soft tissues appear negative. IMPRESSION: 1. Acute lacunar infarct in the medial left lentiform and/or internal capsule. No associated hemorrhage or mass effect. 2. Possible chronic lacune in the posterior right lentiform. Patchy cerebral white matter signal changes which might also reflect chronic  small vessel disease. 3. Questionable exophthalmos, consider Thyroid Eye Disease. Electronically Signed   By: Odessa FlemingH  Hall M.D.   On: 08/04/2018 12:54   Vas [place] Carotid [facility])  Result Date: 08/04/2018 Carotid Arterial Duplex Study Indications:       CVA. Risk Factors:      Hypertension, hyperlipidemia, current smoker. Comparison Study:  no prior Performing Technologist: Jeb LeveringJill Parker RDMS, RVT  Examination Guidelines: A complete evaluation includes B-mode imaging, spectral Doppler, color Doppler, and power Doppler as needed of all accessible portions of each vessel. Bilateral testing is considered an integral part of a complete examination. Limited examinations for  reoccurring indications may be performed as noted.  Right Carotid Findings: +----------+--------+--------+--------+------------+--------+             PSV cm/s EDV cm/s Stenosis Describe     Comments  +----------+--------+--------+--------+------------+--------+  CCA Prox   93       19                                       +----------+--------+--------+--------+------------+--------+  CCA Distal 64       14                                       +----------+--------+--------+--------+------------+--------+  ICA Prox   54       13       1-39%    heterogenous           +----------+--------+--------+--------+------------+--------+  ICA Mid    105      30                             tortuous  +----------+--------+--------+--------+------------+--------+  ICA Distal 126      33                             tortuous  +----------+--------+--------+--------+------------+--------+  ECA        40       6                                        +----------+--------+--------+--------+------------+--------+ +----------+--------+-------+----------------+-------------------+             PSV cm/s EDV cms Describe         Arm Pressure (mmHG)  +----------+--------+-------+----------------+-------------------+  Subclavian 110              Multiphasic, WNL                      +----------+--------+-------+----------------+-------------------+ +---------+--------+--+--------+--+---------+  Vertebral PSV cm/s 54 EDV cm/s 24 Antegrade  +---------+--------+--+--------+--+---------+  Left Carotid Findings: +----------+--------+--------+--------+-----------+--------+             PSV cm/s EDV cm/s Stenosis Describe    Comments  +----------+--------+--------+--------+-----------+--------+  CCA Prox   103      18                                      +----------+--------+--------+--------+-----------+--------+  CCA Distal 42  10                                      +----------+--------+--------+--------+-----------+--------+  ICA Prox   58        12       1-39%    homogeneous           +----------+--------+--------+--------+-----------+--------+  ICA Mid    161      35                            tortuous  +----------+--------+--------+--------+-----------+--------+  ICA Distal 152      47                            tortuous  +----------+--------+--------+--------+-----------+--------+  ECA        41       7                                       +----------+--------+--------+--------+-----------+--------+ +----------+--------+--------+----------------+-------------------+  Subclavian PSV cm/s EDV cm/s Describe         Arm Pressure (mmHG)  +----------+--------+--------+----------------+-------------------+             176               Multiphasic, WNL                      +----------+--------+--------+----------------+-------------------+ +---------+--------+--+--------+--+---------+  Vertebral PSV cm/s 47 EDV cm/s 11 Antegrade  +---------+--------+--+--------+--+---------+  Summary: Right Carotid: Velocities in the right ICA are consistent with a 1-39% stenosis. Left Carotid: Velocities in the left ICA are consistent with a 1-39% stenosis.  *See table(s) above for measurements and observations.     Preliminary         Scheduled Meds:  aspirin EC  81 mg Oral Daily   atorvastatin  80 mg Oral q1800   citalopram  20 mg Oral Daily   enoxaparin (LOVENOX) injection  40 mg Subcutaneous Q24H   levothyroxine  50 mcg Oral QAC breakfast   OLANZapine  10 mg Oral Daily   traZODone  50 mg Oral QHS   Continuous Infusions:   LOS: 1 day     Georgette Shell, MD [facility]  If 7PM-7AM, please contact night-coverage [email or web address] Password [email or web address] 08/05/2018, 11:05 AM

## 2018-08-05 NOTE — Evaluation (Signed)
Physical Therapy Evaluation Patient Details Name: [name] Day MRN: [id number] DOB: 1950/06/24 Today's Date: 08/05/2018   History of Present Illness  [name] Day is an 68 y.o. female with schizoaffective disorder, current tobacco use, HLD, hypothyroidism presented to the ER on 6/26 with slurred speech x2-3 days. She denies any other symptoms. 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 On exam she has moderate dysarthria, no focal deficits. NIHSS 1.    Clinical Impression  Pt admitted with above diagnosis. Pt currently with functional limitations due to the deficits listed below (see PT Problem List). PTA, pt reports living alone. Pt reports she is here because she fell, no mention of this in chart. Today, pt presents with slow processing and possible cognitive deficits, mild imbalance. ECHO and CT transport arriving in session limited evaluation, will cont to assess next session. Pt will benefit from skilled PT to increase their independence and safety with mobility to allow discharge to the venue listed below.       Follow Up Recommendations CIR    Equipment Recommendations  (TBD pending progress)    Recommendations for Other Services       Precautions / Restrictions Precautions Precautions: Fall Restrictions Weight Bearing Restrictions: No      Mobility  Bed Mobility Overal bed mobility: Modified Independent                Transfers Overall transfer level: Modified independent                  Ambulation/Gait Ambulation/Gait assistance: Supervision Gait Distance (Feet): 120 Feet Assistive device: None Gait Pattern/deviations: Step-to pattern Gait velocity: decreased   General Gait Details: mild unsteadiness and poor duel taksing. (stops when talking).   Stairs            Wheelchair Mobility    Modified Rankin (Stroke Patients Only)       Balance Overall balance assessment: Needs assistance   Sitting balance-Leahy  Scale: Fair       Standing balance-Leahy Scale: Poor                               Pertinent Vitals/Pain Pain Assessment: No/denies pain    Home Living Family/patient expects to be discharged to:: Private residence Living Arrangements: Alone Available Help at Discharge: Family;Available PRN/intermittently Type of Home: House Home Access: Level entry     Home Layout: One level Home Equipment: None      Prior Function Level of Independence: Independent               Hand Dominance        Extremity/Trunk Assessment   Upper Extremity Assessment Upper Extremity Assessment: Overall WFL for tasks assessed    Lower Extremity Assessment Lower Extremity Assessment: Overall WFL for tasks assessed       Communication      Cognition Arousal/Alertness: Awake/alert Behavior During Therapy: WFL for tasks assessed/performed Overall Cognitive Status: No family/caregiver present to determine baseline cognitive functioning                                 General Comments: reports she is here from a fall. per chart for slurred speech      General Comments      Exercises     Assessment/Plan    PT Assessment    PT Problem List  PT Treatment Interventions      PT Goals (Current goals can be found in the Care Plan section)  Acute Rehab PT Goals Patient Stated Goal: non stated PT Goal Formulation: With patient Potential to Achieve Goals: Good    Frequency Min 4X/week   Barriers to discharge        Co-evaluation               AM-PAC PT "6 Clicks" Mobility  Outcome Measure Help needed turning from your back to your side while in a flat bed without using bedrails?: A Little Help needed moving from lying on your back to sitting on the side of a flat bed without using bedrails?: A Little Help needed moving to and from a bed to a chair (including a wheelchair)?: A Little Help needed standing up from a chair using your  arms (e.g., wheelchair or bedside chair)?: A Little Help needed to walk in hospital room?: A Lot Help needed climbing 3-5 steps with a railing? : A Lot 6 Click Score: 16    End of Session Equipment Utilized During Treatment: Gait belt Activity Tolerance: Patient tolerated treatment well Patient left: in bed;with call bell/phone within reach Nurse Communication: Mobility status PT Visit Diagnosis: Unsteadiness on feet (R26.81)    Time: [id number] PT Time Calculation (min) (ACUTE ONLY): 18 min   Charges:   PT Evaluation $PT Eval Moderate Complexity: 1 Mod          Etta GrandchildSean Robbie Rideaux, PT, DPT Acute Rehabilitation Services Pager: [phone] Office: [phone]    Etta GrandchildSean Kaulin Chaves 08/05/2018, 12:39 PM

## 2018-08-05 NOTE — Progress Notes (Signed)
  Echocardiogram 2D Echocardiogram has been performed.  [name] Day [name] 08/05/2018, 1:13 PM

## 2018-08-05 NOTE — Consult Note (Addendum)
NEUROLOGY CONSULT  Reason for Consult: Neurologic opinion for stroke Referring Physician: Dr Katrinka BlazingSmith  CC: "I had a stroke"  HPI: [name] Day is an 68 y.o. female with schizoaffective disorder, current tobacco use, HLD, hypothyroidism presented to the ER on 6/26 with slurred speech x2-3 days. She denies any other symptoms. 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 On exam she has moderate dysarthria, no focal deficits. NIHSS 1.  Past Medical History Past Medical History:  Diagnosis Date  . Hyperlipidemia   . Hypothyroidism   . Schizoaffective disorder (HCC)   . Tobacco abuse     Past Surgical History History reviewed. No pertinent surgical history.  Family History Family History  Problem Relation Age of Onset  . Asthma Other   . Hypertension Other   . Heart disease Other   . Diabetes Other     Social History    reports that she has never smoked. She has never used smokeless tobacco. She reports previous drug use. She reports that she does not drink alcohol.  Allergies No Known Allergies  Home Medications Medications Prior to Admission  Medication Sig Dispense Refill  . aspirin EC 81 MG tablet Take 81 mg by mouth daily.    . Calcium Carbonate-Vit D-Min (CALCIUM 1200 PO) Take 1 tablet by mouth daily.    . cholecalciferol (VITAMIN D) 25 MCG (1000 UT) tablet Take 1,000 Units by mouth daily.    . citalopram (CELEXA) 20 MG tablet Take 20 mg by mouth daily.    [name] Kitchen. levothyroxine (SYNTHROID) 50 MCG tablet Take 50 mcg by mouth daily before breakfast.    . lovastatin (MEVACOR) 40 MG tablet Take 40 mg by mouth daily at 6 PM.    . OLANZapine (ZYPREXA) 10 MG tablet Take 10 mg by mouth daily.    . traZODone (DESYREL) 50 MG tablet Take 50 mg by mouth at bedtime.      Hospital Medications . aspirin EC  81 mg Oral Daily  . atorvastatin  80 mg Oral q1800  . citalopram  20 mg Oral Daily  . enoxaparin (LOVENOX) injection  40 mg Subcutaneous Q24H  . levothyroxine  50 mcg  Oral QAC breakfast  . OLANZapine  10 mg Oral Daily  . traZODone  50 mg Oral QHS     ROS: History obtained from pt  General ROS: negative for - chills, fatigue, fever, night sweats, weight gain or weight loss Psychological ROS: negative for - behavioral disorder, hallucinations, memory difficulties, mood swings or suicidal ideation Ophthalmic ROS: negative for - blurry vision, double vision, eye pain or loss of vision ENT ROS: negative for - epistaxis, nasal discharge, oral lesions, sore throat, tinnitus or vertigo Allergy and Immunology ROS: negative for - hives or itchy/watery eyes Hematological and Lymphatic ROS: negative for - bleeding problems, bruising or swollen lymph nodes Endocrine ROS: negative for - galactorrhea, hair pattern changes, polydipsia/polyuria or temperature intolerance Respiratory ROS: negative for - cough, hemoptysis, shortness of breath or wheezing Cardiovascular ROS: negative for - chest pain, dyspnea on exertion, edema or irregular heartbeat Gastrointestinal ROS: negative for - abdominal pain, diarrhea, hematemesis, nausea/vomiting or stool incontinence Genito-Urinary ROS: negative for - dysuria, hematuria, incontinence or urinary frequency/urgency Musculoskeletal ROS: negative for - joint swelling or muscular weakness Neurological ROS: as noted in HPI Dermatological ROS: negative for rash and skin lesion changes   Physical Examination:  Vitals:   08/04/18 2230 08/05/18 0004 08/05/18 0436 08/05/18 0808  BP: 128/78 131/77 (!) 125/56 108/75  Pulse: [name] Kitchen(!)  49 (!) 47 (!) 47 (!) 54  Resp: 18 17 16 17   Temp: 98.6 F (37 C) 98.7 F (37.1 C) 97.9 F (36.6 C) 98.4 F (36.9 C)  TempSrc: Oral Oral Oral Oral  SpO2: 99% 98% 98% 97%  Weight:      Height:        General - no distress  Heart - Regular rate and rhythm - no murmer Lungs - Clear to auscultation Abdomen - Soft - non tender Extremities - Distal pulses intact - no edema Skin - Warm and  dry  Neurologic Examination:   Mental Status:  Alert, oriented, thought content appropriate. Speech without evidence of anomia or aphasia. Able to follow 3 step commands without difficulty. She has moderated dysarthria Cranial Nerves:  II-bilateral visual fields intact III/IV/VI-Pupils were equal and reacted. Extraocular movements were full.  V/VII-no facial numbness and no facial weakness.  VIII-hearing normal.  X-normal speech and symmetrical palatal movement.  XII-midline tongue extension  Motor: Right : Upper extremity   5/5    Left:     Upper extremity   5/5  Lower extremity   5/5     Lower extremity   5/5 Tone and bulk:normal tone throughout; no atrophy noted Sensory: Intact to light touch in all extremities. Deep Tendon Reflexes: 2/4 throughout Plantars: Downgoing bilaterally  Cerebellar: Normal finger to nose and heel to shin bilaterally. Gait: not tested NIHSS: 1  LABORATORY STUDIES:  Basic Metabolic Panel: Recent Labs  Lab 08/04/18 1109 08/05/18 0353  NA 144 141  K 3.9 3.5  CL 108 103  CO2 29 27  GLUCOSE 92 87  BUN 12 12  CREATININE 0.98 0.82  CALCIUM 9.6 9.4    Liver Function Tests: Recent Labs  Lab 08/04/18 1109  AST 20  ALT 22  ALKPHOS 80  BILITOT 0.6  PROT 6.5  ALBUMIN 3.4*   No results for input(s): LIPASE, AMYLASE in the last 168 hours. No results for input(s): AMMONIA in the last 168 hours.  CBC: Recent Labs  Lab 08/04/18 1109 08/05/18 0353  WBC 7.1 6.7  NEUTROABS 4.7 3.9  HGB 11.4* 11.9*  HCT 36.9 38.0  MCV 82.9 81.9  PLT 204 185    Cardiac Enzymes: No results for input(s): CKTOTAL, CKMB, CKMBINDEX, TROPONINI in the last 168 hours.  BNP: Invalid input(s): POCBNP  CBG: Recent Labs  Lab 08/04/18 1017  GLUCAP 89    Microbiology:   Coagulation Studies: No results for input(s): LABPROT, INR in the last 72 hours.  Urinalysis:  Recent Labs  Lab 08/04/18 2020  COLORURINE YELLOW  LABSPEC 1.016  PHURINE 7.0   GLUCOSEU NEGATIVE  HGBUR NEGATIVE  BILIRUBINUR NEGATIVE  KETONESUR NEGATIVE  PROTEINUR NEGATIVE  NITRITE NEGATIVE  LEUKOCYTESUR NEGATIVE    Lipid Panel:     Component Value Date/Time   CHOL 180 08/05/2018 0353   TRIG 44 08/05/2018 0353   HDL 81 08/05/2018 0353   CHOLHDL 2.2 08/05/2018 0353   VLDL 9 08/05/2018 0353   LDLCALC 90 08/05/2018 0353    HgbA1C:  Lab Results  Component Value Date   HGBA1C 6.2 (H) 08/05/2018    Urine Drug Screen:  No results found for: LABOPIA, COCAINSCRNUR, LABBENZ, AMPHETMU, THCU, LABBARB   Alcohol Level:  No results for input(s): ETH in the last 168 hours.  Miscellaneous labs:  EKG  EKG  IMAGING: Ct Head Wo Contrast  Result Date: 08/04/2018 CLINICAL DATA:  Slurred speech for 2 days EXAM: CT HEAD WITHOUT CONTRAST TECHNIQUE: Contiguous  axial images were obtained from the base of the skull through the vertex without intravenous contrast. COMPARISON:  None. FINDINGS: Brain: No evidence of acute infarction, hemorrhage, hydrocephalus, extra-axial collection or mass lesion/mass effect. Vascular: No hyperdense vessel or unexpected calcification. Skull: No osseous abnormality. Sinuses/Orbits: Visualized paranasal sinuses are clear. Visualized mastoid sinuses are clear. Visualized orbits demonstrate no focal abnormality. Other: None IMPRESSION: No acute intracranial pathology. Electronically Signed   By: Elige KoHetal  Patel   On: 08/04/2018 10:53   Mr Brain Wo Contrast (neuro Protocol)  Result Date: 08/04/2018 CLINICAL DATA:  68 year old female with slurred speech. EXAM: MRI HEAD WITHOUT CONTRAST TECHNIQUE: Multiplanar, multiecho pulse sequences of the brain and surrounding structures were obtained without intravenous contrast. COMPARISON:  Head CT earlier today. FINDINGS: Brain: Linear 15 millimeter area of restricted diffusion in the medial left lentiform and/or posterior limb left internal capsule (series 5, image 72). Faint associated T2 and FLAIR  hyperintensity. No associated hemorrhage or mass effect. No other restricted diffusion. No midline shift, mass effect, evidence of mass lesion, ventriculomegaly, extra-axial collection or acute intracranial hemorrhage. Cervicomedullary junction and pituitary are within normal limits. Scattered and patchy cerebral white matter T2 and FLAIR hyperintensity, mostly in the periatrial regions. No cortical encephalomalacia or chronic cerebral blood products. Possible chronic lacunar infarct in the posterior right lentiform. Minor T2 heterogeneity in the pons. Negative cerebellum. Vascular: Major intracranial vascular flow voids are preserved. Skull and upper cervical spine: Negative visible cervical spine. Visualized bone marrow signal is within normal limits. Sinuses/Orbits: Questionable mild proptosis, thickening of the bilateral extraocular muscles and/or increased intraorbital fat. Paranasal sinuses and mastoids are stable and well pneumatized. Other: Grossly normal visible internal auditory structures. Scalp and face soft tissues appear negative. IMPRESSION: 1. Acute lacunar infarct in the medial left lentiform and/or internal capsule. No associated hemorrhage or mass effect. 2. Possible chronic lacune in the posterior right lentiform. Patchy cerebral white matter signal changes which might also reflect chronic small vessel disease. 3. Questionable exophthalmos, consider Thyroid Eye Disease. Electronically Signed   By: Odessa FlemingH  Hall M.D.   On: 08/04/2018 12:54   [facility] [place] [facility]  Result Date: 08/04/2018 Carotid Arterial Duplex Study Indications:       CVA. Risk Factors:      Hypertension, hyperlipidemia, current smoker. Comparison Study:  no prior Performing Technologist: Jeb LeveringJill Parker RDMS, RVT  Examination Guidelines: A complete evaluation includes B-mode imaging, spectral Doppler, color Doppler, and power Doppler as needed of all accessible portions of each vessel. Bilateral testing is considered an  integral part of a complete examination. Limited examinations for reoccurring indications may be performed as noted.  Right Carotid Findings: +----------+--------+--------+--------+------------+--------+           PSV cm/sEDV cm/sStenosisDescribe    Comments +----------+--------+--------+--------+------------+--------+ CCA Prox  93      19                                   +----------+--------+--------+--------+------------+--------+ CCA Distal64      14                                   +----------+--------+--------+--------+------------+--------+ ICA Prox  54      13      1-39%   heterogenous         +----------+--------+--------+--------+------------+--------+ ICA Mid   105  30                          tortuous +----------+--------+--------+--------+------------+--------+ ICA Distal126     33                          tortuous +----------+--------+--------+--------+------------+--------+ ECA       40      6                                    +----------+--------+--------+--------+------------+--------+ +----------+--------+-------+----------------+-------------------+           PSV cm/sEDV cmsDescribe        Arm Pressure (mmHG) +----------+--------+-------+----------------+-------------------+ Subclavian110            Multiphasic, WNL                    +----------+--------+-------+----------------+-------------------+ +---------+--------+--+--------+--+---------+ VertebralPSV cm/s54EDV cm/s24Antegrade +---------+--------+--+--------+--+---------+  Left Carotid Findings: +----------+--------+--------+--------+-----------+--------+           PSV cm/sEDV cm/sStenosisDescribe   Comments +----------+--------+--------+--------+-----------+--------+ CCA Prox  103     18                                  +----------+--------+--------+--------+-----------+--------+ CCA Distal42      10                                   +----------+--------+--------+--------+-----------+--------+ ICA Prox  58      12      1-39%   homogeneous         +----------+--------+--------+--------+-----------+--------+ ICA Mid   161     35                         tortuous +----------+--------+--------+--------+-----------+--------+ ICA Distal152     47                         tortuous +----------+--------+--------+--------+-----------+--------+ ECA       41      7                                   +----------+--------+--------+--------+-----------+--------+ +----------+--------+--------+----------------+-------------------+ SubclavianPSV cm/sEDV cm/sDescribe        Arm Pressure (mmHG) +----------+--------+--------+----------------+-------------------+           176             Multiphasic, WNL                    +----------+--------+--------+----------------+-------------------+ +---------+--------+--+--------+--+---------+ VertebralPSV cm/s47EDV cm/s11Antegrade +---------+--------+--+--------+--+---------+  Summary: Right Carotid: Velocities in the right ICA are consistent with a 1-39% stenosis. Left Carotid: Velocities in the left ICA are consistent with a 1-39% stenosis.  *See table(s) above for measurements and observations.     Preliminary      Assessment/Plan: This is a [age]yr old lady with schizoaffective disorder, current tobacco use, HLD, hypothyroidism presented to the ER on 6/26 with slurred speech x2-3 days. NIHSS is 1. MRI shows a small acute left lentiform/internal capsule infarct. Likely small vessel etiology.   # Acute lacunar stroke- left lentiform/internal capsule. Con't ASA daily for now. CTA pending, may  need DAPT. No carotid stenosis seen on CUS. # Dysarthria- d/t above con't to work with SLP. Cleared for diet # HLD- LDL 90, goal <70. Increase home statin from ->80mg  # Tobacco smoker- counseled on quitting # Hyperglycemia- A1c 6.2. No prev DM2 dx. Will need close PCP f/u and  repeat in 61mo. Tight glucose control for max vascular mgt for secondary stroke prevention.    RECS: CTA head to complete vascular wk up Echo Stroke team Rehab efforts Counseled on tobacco cessation   Attending neurologist's note to follow  Desiree Metzger-Cihelka, ARNP-C, ANVP-BC Pager: [phone]   NEUROHOSPITALIST ADDENDUM Performed a face to face diagnostic evaluation.   I have reviewed the contents of history and physical exam as documented by PA/ARNP/Resident and agree with above documentation.  I have discussed and formulated the above plan as documented. Edits to the note have been made as needed.   Patient has mild dysarthria with no significant focal motor deficits.  MRI confirms left basal ganglia/internal capsule infarct.  Likely secondary to small vessel disease.  Reviewed CT angiogram-patient has mild intracranial atherosclerosis with no significant occlusions in any large vessel in the head or neck. Recommend dual antiplatelets for 3 weeks and then aspirin alone.  Risk factor modification.  Follow-up echo      MD [facility] [phone]   If 7pm to 7am, please call on call as listed on AMION.

## 2018-08-06 DIAGNOSIS — I63312 Cerebral infarction due to thrombosis of left middle cerebral artery: Secondary | ICD-10-CM

## 2018-08-06 DIAGNOSIS — E785 Hyperlipidemia, unspecified: Secondary | ICD-10-CM

## 2018-08-06 NOTE — Progress Notes (Signed)
Son would like the Pt. To go to rehab because they only have his daughter there to help. He would be more comfortable if that happens before she goes home. He stated he would like her to be at least 90% better.

## 2018-08-06 NOTE — Progress Notes (Signed)
Tele report: SR to SB lowest to 54.
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
## 2018-08-06 NOTE — Progress Notes (Signed)
Physical Therapy Treatment Patient Details Name: [name] Day MRN: [id number] DOB: December 25, 1950 Today's Date: 08/06/2018    History of Present Illness [name] Day is an 68 y.o. female with schizoaffective disorder, current tobacco use, HLD, hypothyroidism presented to the ER on 6/26 with slurred speech x2-3 days. She denies any other symptoms. 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 On exam she has moderate dysarthria, no focal deficits. NIHSS 1.    PT Comments    Patient improving with therapy today, increasing ambulation distance and performing stairs. Cont to rec CIR at this time, however with further progress may be able to progress to HHPT if family can provide assistance.    Follow Up Recommendations  CIR     Equipment Recommendations  (TBD pending progress)    Recommendations for Other Services       Precautions / Restrictions Precautions Precautions: Fall Restrictions Weight Bearing Restrictions: No    Mobility  Bed Mobility Overal bed mobility: Modified Independent                Transfers Overall transfer level: Modified independent                  Ambulation/Gait Ambulation/Gait assistance: Supervision   Assistive device: None Gait Pattern/deviations: Step-to pattern Gait velocity: decreased   General Gait Details: mild unsteadiness and poor duel taksing. (stops when talking).    Stairs Stairs: Yes Stairs assistance: Supervision Stair Management: Two rails Number of Stairs: 5 General stair comments: alternating pattern, no LOB   Wheelchair Mobility    Modified Rankin (Stroke Patients Only)       Balance Overall balance assessment: Needs assistance   Sitting balance-Leahy Scale: Fair       Standing balance-Leahy Scale: Poor                              Cognition Arousal/Alertness: Awake/alert Behavior During Therapy: WFL for tasks assessed/performed Overall Cognitive Status: No  family/caregiver present to determine baseline cognitive functioning                                 General Comments: reports she is here from a fall. per chart for slurred speech      Exercises      General Comments        Pertinent Vitals/Pain      Home Living                      Prior Function            PT Goals (current goals can now be found in the care plan section) Acute Rehab PT Goals Patient Stated Goal: non stated PT Goal Formulation: With patient Potential to Achieve Goals: Good Progress towards PT goals: Progressing toward goals    Frequency    Min 4X/week      PT Plan Current plan remains appropriate    Co-evaluation              AM-PAC PT "6 Clicks" Mobility   Outcome Measure  Help needed turning from your back to your side while in a flat bed without using bedrails?: A Little Help needed moving from lying on your back to sitting on the side of a flat bed without using bedrails?: A Little Help needed moving to and from a bed to a  chair (including a wheelchair)?: A Little Help needed standing up from a chair using your arms (e.g., wheelchair or bedside chair)?: A Little Help needed to walk in hospital room?: A Lot Help needed climbing 3-5 steps with a railing? : A Lot 6 Click Score: 16    End of Session Equipment Utilized During Treatment: Gait belt Activity Tolerance: Patient tolerated treatment well Patient left: in bed;with call bell/phone within reach Nurse Communication: Mobility status PT Visit Diagnosis: Unsteadiness on feet (R26.81)     Time: 9563-8756 PT Time Calculation (min) (ACUTE ONLY): 20 min  Charges:  $Gait Training: 8-22 mins                     Reinaldo Berber, PT, DPT Acute Rehabilitation Services Pager: [phone] Office: [place] 08/06/2018, 1:56 PM

## 2018-08-06 NOTE — Progress Notes (Signed)
STROKE TEAM PROGRESS NOTE   SUBJECTIVE (INTERVAL HISTORY) No family is at the bedside.  Patient is lying in bed, still has slurred speech, however denies any motor weakness.  She denies any history of stroke in the past.  She stated that she quit smoking 2 months ago.   OBJECTIVE Vitals:   08/05/18 1955 08/05/18 2316 08/06/18 0441 08/06/18 0859  BP: 132/73 125/61 (!) 131/56 (!) 144/65  Pulse: (!) 47 (!) 55 (!) 48 (!) 52  Resp: 16 17 16 17   Temp: 98.4 F (36.9 C) 97.7 F (36.5 C) 97.7 F (36.5 C) 97.9 F (36.6 C)  TempSrc: Oral Oral Oral Oral  SpO2: 99% 97% 98% 100%  Weight:      Height:        CBC:  Recent Labs  Lab 08/04/18 1109 08/05/18 0353  WBC 7.1 6.7  NEUTROABS 4.7 3.9  HGB 11.4* 11.9*  HCT 36.9 38.0  MCV 82.9 81.9  PLT 204 742    Basic Metabolic Panel:  Recent Labs  Lab 08/04/18 1109 08/05/18 0353  NA 144 141  K 3.9 3.5  CL 108 103  CO2 29 27  GLUCOSE 92 87  BUN 12 12  CREATININE 0.98 0.82  CALCIUM 9.6 9.4    Lipid Panel:     Component Value Date/Time   CHOL 180 08/05/2018 0353   TRIG 44 08/05/2018 0353   HDL 81 08/05/2018 0353   CHOLHDL 2.2 08/05/2018 0353   VLDL 9 08/05/2018 0353   LDLCALC 90 08/05/2018 0353   HgbA1c:  Lab Results  Component Value Date   HGBA1C 6.2 (H) 08/05/2018   Urine Drug Screen:     Component Value Date/Time   LABOPIA NONE DETECTED 08/04/2018 2020   COCAINSCRNUR NONE DETECTED 08/04/2018 2020   LABBENZ NONE DETECTED 08/04/2018 2020   AMPHETMU NONE DETECTED 08/04/2018 2020   THCU NONE DETECTED 08/04/2018 2020   LABBARB NONE DETECTED 08/04/2018 2020    Alcohol Level No results found for: ETH  IMAGING  Ct Angio Head W Or Wo Contrast Ct Angio Neck W Or Wo Contrast 08/05/2018 IMPRESSION:  1. Mild intracranial atherosclerosis without large vessel occlusion or significant stenosis.  2. Widely patent cervical carotid and vertebral arteries.   Ct Head Wo Contrast 08/04/2018 IMPRESSION:  No acute intracranial  pathology.  Mr Brain Wo Contrast (neuro Protocol) 08/04/2018 IMPRESSION:  1. Acute lacunar infarct in the medial left lentiform and/or internal capsule. No associated hemorrhage or mass effect.  2. Possible chronic lacune in the posterior right lentiform. Patchy cerebral white matter signal changes which might also reflect chronic small vessel disease.  3. Questionable exophthalmos, consider Thyroid Eye Disease.   Vas US Carotid (at [facility] Only) 08/04/2018 Summary:  Right Carotid: Velocities in the right ICA are consistent with a 1-39% stenosis.  Left Carotid: Velocities in the left ICA are consistent with a 1-39% stenosis.   Transthoracic Echocardiogram  08/05/2018 IMPRESSIONS  1. The left ventricle has normal systolic function with an ejection fraction of 60-65%. The cavity size was normal. Left ventricular diastolic Doppler parameters are consistent with impaired relaxation.  2. The right ventricle has normal systolic function. The cavity was normal. There is no increase in right ventricular wall thickness.  3. Left atrial size was mild-moderately dilated.  4. No evidence of mitral valve stenosis.  5. The aortic valve is tricuspid. No stenosis of the aortic valve.  6. The aortic root is normal in size and structure.  7. Pulmonary hypertension is  indeterminant, inadequate TR jet.  8. The interatrial septum was not well visualized.   EKG - SB rate 53 BPM. "Aflutter is no longer present compared to 2008"  (See cardiology reading for complete details)   PHYSICAL EXAM  Temp:  [97.7 F (36.5 C)-98.4 F (36.9 C)] 97.9 F (36.6 C) (06/28 0859) Pulse Rate:  [47-55] 52 (06/28 0859) Resp:  [15-20] 17 (06/28 0859) BP: (122-144)/(56-73) 144/65 (06/28 0859) SpO2:  [97 %-100 %] 100 % (06/28 0859)  General - Well nourished, well developed, in no apparent distress.  Ophthalmologic - fundi not visualized due to noncooperation.  Cardiovascular - Regular rate and rhythm.  Mental Status  -  Level of arousal and orientation to time, place, and person were intact. Language including expression, naming, repetition, comprehension was assessed and found intact.  Mild to moderate dysarthria Fund of Knowledge was assessed and was intact.  Cranial Nerves II - XII - II - Visual field intact OU. III, IV, VI - Extraocular movements intact. V - Facial sensation intact bilaterally. VII - slight right nasolabial fold flattening. VIII - Hearing & vestibular intact bilaterally. X - Palate elevates symmetrically.  Mild to moderate dysarthria XI - Chin turning & shoulder shrug intact bilaterally. XII - Tongue protrusion intact.  Motor Strength - The patient's strength was normal in all extremities and pronator drift was absent.  Bulk was normal and fasciculations were absent.   Motor Tone - Muscle tone was assessed at the neck and appendages and was normal.  Reflexes - The patient's reflexes were symmetrical in all extremities and she had no pathological reflexes.  Sensory - Light touch, temperature/pinprick were assessed and were symmetrical.    Coordination - The patient had normal movements in the hands and feet with no ataxia or dysmetria.  Tremor was absent.  Gait and Station - deferred.   ASSESSMENT/PLAN [name] Day is a 68 y.o. female with history of schizoaffective disorder, current tobacco use, HLD, hypothyroidism and possible DM presenting with dysarthria. She did not receive IV t-PA due to late presentation (>4.5 hours from time of onset).  Stroke:  Acute lacunar infarct in the medial left lentiform and/or internal capsule - likely due to small vessel disease.  Resultant mild to moderate dysarthria  CT head - No acute intracranial pathology.  MRI head - Acute lacunar infarct in the medial left lentiform and/or internal capsule.  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   Carotid Doppler - 1-39% ICA stenosis bilaterally  2D Echo  - EF 60 - 65%. No cardiac source of emboli identified.   Sars Corona Virus 2  - negative  LDL - 90  HgbA1c - 6.2  UDS  - negative  VTE prophylaxis - Lovenox  Diet  - Heart healthy with thin liquids.  aspirin 81 mg daily prior to admission, now on aspirin 81 mg daily and clopidogrel 75 mg daily.  Continue DAPT for 3 weeks and then Plavix alone.  Patient counseled to be compliant with her antithrombotic medications  Ongoing aggressive stroke risk factor management  Therapy recommendations:  CIR recommended  Disposition:  Pending  Hypertension  Stable . Permissive hypertension (OK if < 220/120) but gradually normalize in 3-5 days . Long-term BP goal normotensive  Hyperlipidemia  Lipid lowering medication PTA:  Mevacor 40 mg daily  LDL 90, goal < 70  Current lipid lowering medication: Lipitor 20 mg daily  Continue statin at  discharge  Former smoker  Quit 2 months ago  Continued smoking cessation education provided  Patient willing to continue to quit smoking  ?? Hx of Aflutter  EKG sinus rhythm.  However, it reads " a flutter is no longer present comparing with 2008".  No previous EKG for review  No documentation of previous a flutter  Current stroke does not show embolic pattern  Recommend follow-up with cardiology as outpatient  Other Stroke Risk Factors  Advanced age  Hx of substance abuse  Hx stroke/TIA - Possible chronic lacune in the posterior right lentiform by MRI  Other Active Problems  Bradycardia  Schizoaffective disorder -on olanzapine and Celexa and trazodone   Hospital day # 2  Neurology will sign off. Please call with questions. [facility]. Thanks for the consult.  Marvel PlanJindong Shannon Balthazar, MD PhD Stroke Neurology 08/06/2018 12:34 PM    To contact Stroke Continuity provider, please refer to [email or web address]. After  hours, contact General Neurology

## 2018-08-06 NOTE — Plan of Care (Signed)
Pt ate all of her breakfast. Ambulated to the BR, standby assistance. Pt sitting in bed comfortably with the bed alarm on.
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
## 2018-08-07 NOTE — NC FL2 (Signed)
[place] LEVEL OF CARE SCREENING TOOL     IDENTIFICATION  Patient Name: [name] Day Birthdate: [birth year] Sex: female Admission Date (Current Location): 08/04/2018  [facility] and [place] Number:  Herbalist and Address:  [facility] [place], [place] [address] [place], [place] [address]      Provider Number: [id number]  Attending Physician Name and Address:  Georgette Shell, MD  Relative Name and Phone Number:       Current Level of Care: Hospital Recommended Level of Care: [place] Prior Approval Number:    Date Approved/Denied:   PASRR Number: Manual review  Discharge Plan: SNF    Current Diagnoses: Patient Active Problem List   Diagnosis Date Noted  . CVA (cerebral vascular accident) ([place]) 08/04/2018  . Tobacco abuse   . Hypothyroidism   . Schizoaffective disorder ([place])   . Hyperlipidemia     Orientation RESPIRATION BLADDER Height & Weight     Self, Time, Situation, Place  Normal Continent Weight: 146 lb (66.2 kg) Height:  5\' 1"  (154.9 cm)  BEHAVIORAL SYMPTOMS/MOOD NEUROLOGICAL BOWEL NUTRITION STATUS      Continent Diet(see DC summary)  AMBULATORY STATUS COMMUNICATION OF NEEDS Skin   Limited Assist Verbally Normal                       Personal Care Assistance Level of Assistance  Bathing, Feeding, Dressing Bathing Assistance: Limited assistance Feeding assistance: Independent Dressing Assistance: Limited assistance     Functional Limitations Info  Sight, Hearing, Speech Sight Info: Adequate Hearing Info: Adequate Speech Info: Impaired(dysarthria)    SPECIAL CARE FACTORS FREQUENCY  PT (By licensed PT), OT (By licensed OT), Speech therapy     PT Frequency: 5x/wk OT Frequency: 5x/wk     Speech Therapy Frequency: 5x/wk      Contractures Contractures Info: Not present    Additional Factors Info  Code Status, Allergies, Psychotropic Code Status Info: Full Allergies Info:  NKA Psychotropic Info: Celexa 20mg  daily; Zyprexa 10mg  daily         Current Medications (08/07/2018):  This is the current hospital active medication list Current Facility-Administered Medications  Medication Dose Route Frequency Provider Last Rate Last Dose  . acetaminophen (TYLENOL) tablet 650 mg  650 mg Oral Q4H PRN Fuller Plan A, MD       Or  . acetaminophen (TYLENOL) solution 650 mg  650 mg Per Tube Q4H PRN Fuller Plan A, MD       Or  . acetaminophen (TYLENOL) suppository 650 mg  650 mg Rectal Q4H PRN Fuller Plan A, MD      . aspirin EC tablet 81 mg  81 mg Oral Daily Tamala Julian, Rondell A, MD   81 mg at 08/07/18 0928  . atorvastatin (LIPITOR) tablet 20 mg  20 mg Oral q1800 Rosalin Hawking, MD   20 mg at 08/06/18 1739  . citalopram (CELEXA) tablet 20 mg  20 mg Oral Daily Fuller Plan A, MD   20 mg at 08/07/18 0928  . clopidogrel (PLAVIX) tablet 75 mg  75 mg Oral Daily Rosalin Hawking, MD   75 mg at 08/07/18 0927  . enoxaparin (LOVENOX) injection 40 mg  40 mg Subcutaneous Q24H Smith, Rondell A, MD   40 mg at 08/06/18 1738  . levothyroxine (SYNTHROID) tablet 50 mcg  50 mcg Oral QAC breakfast Fuller Plan A, MD   50 mcg at 08/07/18 0610  . OLANZapine (ZYPREXA) tablet 10 mg  10 mg Oral Daily Madelyn FlavorsSmith, Rondell A, MD   10 mg at 08/07/18 0928  . senna-docusate (Senokot-S) tablet 1 tablet  1 tablet Oral QHS PRN Smith, Rondell A, MD      . sodium chloride flush (NS) 0.9 % injection 10-40 mL  10-40 mL Intracatheter PRN Alwyn RenMathews, Haziel Molner G, MD      . traZODone (DESYREL) tablet 50 mg  50 mg Oral QHS Madelyn FlavorsSmith, Rondell A, MD   50 mg at 08/06/18 2210     Discharge Medications: Please see discharge summary for a list of discharge medications.  Relevant Imaging Results:  Relevant Lab Results:   Additional Information SS#: [id number]  Baldemar LenisElizabeth M Shevonne Wolf, LCSW

## 2018-08-07 NOTE — Evaluation (Signed)
Occupational Therapy Evaluation Patient Details Name: [name] Day MRN: [id number] DOB: 03-Jun-1950 Today's Date: 08/07/2018    History of Present Illness [name] Day is an 68 y.o. female with schizoaffective disorder, current tobacco use, HLD, hypothyroidism presented to the ER on 6/26 with slurred speech x2-3 days. She denies any other symptoms. 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 On exam she has moderate dysarthria, no focal deficits. NIHSS 1.   Clinical Impression   Pt reports being independent in self care, meal prep and housekeeping prior to admission. Her neighbor has been picking up her groceries. She reports her granddaughter lives with her, but works. Pt with inconsistencies in reporting history. Pt presents with unsteady gait and impaired cognition. She does not have 24 hour care at home, therefore recommending SNF. Will follow acutely.    Follow Up Recommendations  SNF;Supervision/Assistance - 24 hour(HHOT if family can provide 24 hour supervision)    Equipment Recommendations  None recommended by OT    Recommendations for Other Services       Precautions / Restrictions Precautions Precautions: Fall Restrictions Weight Bearing Restrictions: No      Mobility Bed Mobility               General bed mobility comments: pt received in chair  Transfers Overall transfer level: Needs assistance Equipment used: None Transfers: Sit to/from Stand Sit to Stand: Supervision              Balance Overall balance assessment: Needs assistance   Sitting balance-Leahy Scale: Good Sitting balance - Comments: no LOB donning socks     Standing balance-Leahy Scale: Poor Standing balance comment: poor dynamic, fair static                           ADL either performed or assessed with clinical judgement   ADL Overall ADL's : Needs assistance/impaired Eating/Feeding: Independent;Sitting Eating/Feeding Details (indicate cue type  and reason): opened orange juice container without difficulty Grooming: Wash/dry hands;Standing;Supervision/safety Grooming Details (indicate cue type and reason): decreased problem solving when soap dispenser empty Upper Body Bathing: Supervision/ safety;Sitting   Lower Body Bathing: Supervison/ safety;Sit to/from stand   Upper Body Dressing : Set up;Sitting   Lower Body Dressing: Supervision/safety;Sit to/from stand Lower Body Dressing Details (indicate cue type and reason): donned socks in sitting bending forward Toilet Transfer: Min guard;Ambulation   Toileting- Clothing Manipulation and Hygiene: Supervision/safety;Sit to/from stand       Functional mobility during ADLs: Min guard(pt reaching for furniture throughout room)       Vision Patient Visual Report: No change from baseline       Perception     Praxis      Pertinent Vitals/Pain Pain Assessment: No/denies pain     Hand Dominance Right   Extremity/Trunk Assessment Upper Extremity Assessment Upper Extremity Assessment: Overall WFL for tasks assessed           Communication Communication Communication: Expressive difficulties   Cognition Arousal/Alertness: Awake/alert Behavior During Therapy: WFL for tasks assessed/performed Overall Cognitive Status: Impaired/Different from baseline Area of Impairment: Safety/judgement;Memory;Problem solving;Orientation                 Orientation Level: Disoriented to;Time;Situation   Memory: Decreased short-term memory   Safety/Judgement: Decreased awareness of safety;Decreased awareness of deficits   Problem Solving: Slow processing;Decreased initiation;Difficulty sequencing;Requires verbal cues;Requires tactile cues General Comments: pt with varying reports of assistance she has at home   General  Comments       Exercises     Shoulder Instructions      Home Living Family/patient expects to be discharged to:: Private residence Living Arrangements:  Alone Available Help at Discharge: Family;Available PRN/intermittently Type of Home: House Home Access: Stairs to enter Entergy CorporationEntrance Stairs-Number of Steps: 4   Home Layout: One level     Bathroom Shower/Tub: Chief Strategy OfficerTub/shower unit   Bathroom Toilet: Standard     Home Equipment: None      Lives With: Loss adjuster, charteredamily(granddaughter)    Prior Functioning/Environment Level of Independence: Independent        Comments: reports her neighbor gets her groceries        OT Problem List: Impaired balance (sitting and/or standing);Decreased activity tolerance;Decreased cognition;Decreased knowledge of use of DME or AE      OT Treatment/Interventions: Self-care/ADL training;DME and/or AE instruction;Cognitive remediation/compensation;Patient/family education;Balance training;Therapeutic activities    OT Goals(Current goals can be found in the care plan section) Acute Rehab OT Goals Patient Stated Goal: to go home OT Goal Formulation: With patient Time For Goal Achievement: 08/21/18 Potential to Achieve Goals: Good ADL Goals Pt Will Perform Grooming: standing;Independently Pt Will Perform Lower Body Bathing: sit to/from stand Pt Will Perform Lower Body Dressing: Independently;sit to/from stand Pt Will Transfer to Toilet: Independently;ambulating;regular height toilet Pt Will Perform Toileting - Clothing Manipulation and hygiene: Independently;sit to/from stand Pt Will Perform Tub/Shower Transfer: Tub transfer;ambulating;with supervision Additional ADL Goal #1: Pt will participate in formal functional cognitive assessment.  OT Frequency: Min 2X/week   Barriers to D/C: Decreased caregiver support          Co-evaluation              AM-PAC OT "6 Clicks" Daily Activity     Outcome Measure Help from another person eating meals?: None Help from another person taking care of personal grooming?: A Little Help from another person toileting, which includes using toliet, bedpan, or urinal?: A  Little Help from another person bathing (including washing, rinsing, drying)?: A Little Help from another person to put on and taking off regular upper body clothing?: None Help from another person to put on and taking off regular lower body clothing?: A Little 6 Click Score: 20   End of Session Equipment Utilized During Treatment: Gait belt  Activity Tolerance: Patient tolerated treatment well Patient left: with call bell/phone within reach;in chair;with chair alarm set  OT Visit Diagnosis: Other abnormalities of gait and mobility (R26.89);Other symptoms and signs involving cognitive function                Time: 1013-1030 OT Time Calculation (min): 17 min Charges:  OT General Charges $OT Visit: 1 Visit OT Evaluation $OT Eval Moderate Complexity: 1 Mod  Martie RoundJulie Afton Lavalle, OTR/L Acute Rehabilitation Services Pager: [phone] Office: [phone]  Evern BioMayberry, Parys Elenbaas Lynn 08/07/2018, 11:12 AM

## 2018-08-07 NOTE — Progress Notes (Signed)
Physical Therapy Treatment Patient Details Name: [name] Day MRN: [id number] DOB: Aug 20, 1950 Today's Date: 08/07/2018    History of Present Illness [name] Day is an 68 y.o. female with schizoaffective disorder, current tobacco use, HLD, hypothyroidism presented to the ER on 6/26 with slurred speech x2-3 days. She denies any other symptoms. 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 On exam she has moderate dysarthria, no focal deficits. NIHSS 1.    PT Comments    Pt con't to present with severe dysarthria and impaired cognition in addition to impaired balance and decreased insight safety and deficits. Spoke with sister and there is no one that can provide 24/7 assist for patient. With both cognitive and functional deficits pt is unsafe to return home alone. Pt to benefit from SNF upon d/c to achieve safe mod I level of function.    Follow Up Recommendations  SNF((would benefit from CIR however was denied()     Equipment Recommendations  (none)    Recommendations for Other Services       Precautions / Restrictions Precautions Precautions: Fall Restrictions Weight Bearing Restrictions: No    Mobility  Bed Mobility Overal bed mobility: Needs Assistance Bed Mobility: Supine to Sit     Supine to sit: Supervision     General bed mobility comments: HOB elevated, no physical assist needed  Transfers Overall transfer level: Needs assistance Equipment used: None Transfers: Sit to/from Stand Sit to Stand: Min guard         General transfer comment: pt stood up with difficulty hwoever was unsteady upon upright position and reaching for something to hold onto  Ambulation/Gait Ambulation/Gait assistance: Min guard Gait Distance (Feet): 200 Feet Assistive device: None Gait Pattern/deviations: Step-through pattern;Decreased stride length;Narrow base of support;Staggering left;Staggering right Gait velocity: dec   General Gait Details: pt with  staggering L/R and reaching for hallway rail or other objects to reach for to steady self   Stairs Stairs: Yes Stairs assistance: Min assist Stair Management: One rail Right Number of Stairs: 10 General stair comments: alternating pattern, more difficulty sequencing ascending than descending   Wheelchair Mobility    Modified Rankin (Stroke Patients Only) Modified Rankin (Stroke Patients Only) Pre-Morbid Rankin Score: Slight disability Modified Rankin: Moderate disability     Balance Overall balance assessment: Needs assistance Sitting-balance support: No upper extremity supported Sitting balance-Leahy Scale: Good Sitting balance - Comments: no LOB donning socks     Standing balance-Leahy Scale: Poor Standing balance comment: dependent on single UE support to be steady                 Standardized Balance Assessment Standardized Balance Assessment : Dynamic Gait Index   Dynamic Gait Index Level Surface: Mild Impairment Change in Gait Speed: Mild Impairment Gait with Horizontal Head Turns: Mild Impairment Gait with Vertical Head Turns: Mild Impairment Gait and Pivot Turn: Mild Impairment Step Over Obstacle: Moderate Impairment Step Around Obstacles: Mild Impairment Steps: Mild Impairment Total Score: 15      Cognition Arousal/Alertness: Awake/alert Behavior During Therapy: WFL for tasks assessed/performed Overall Cognitive Status: Impaired/Different from baseline Area of Impairment: Safety/judgement;Memory;Problem solving;Orientation                 Orientation Level: Disoriented to;Time;Situation   Memory: Decreased short-term memory   Safety/Judgement: Decreased awareness of safety;Decreased awareness of deficits   Problem Solving: Slow processing;Decreased initiation;Difficulty sequencing;Requires verbal cues;Requires tactile cues General Comments: pt with delayed processing and decreased insight to deficits. Pt with difficulty sequening  multistep commands ie. directions for DGI. Pt unaware that her speech is altered      Exercises      General Comments General comments (skin integrity, edema, etc.): VSS, spoke with sister who states she can't help      Pertinent Vitals/Pain Pain Assessment: No/denies pain    Home Living Family/patient expects to be discharged to:: Private residence Living Arrangements: Alone Available Help at Discharge: Family;Available PRN/intermittently Type of Home: House Home Access: Stairs to enter   Home Layout: One level Home Equipment: None      Prior Function Level of Independence: Independent      Comments: reports her neighbor gets her groceries   PT Goals (current goals can now be found in the care plan section) Acute Rehab PT Goals Patient Stated Goal: go home Progress towards PT goals: Progressing toward goals    Frequency    Min 4X/week      PT Plan Current plan remains appropriate    Co-evaluation              AM-PAC PT "6 Clicks" Mobility   Outcome Measure  Help needed turning from your back to your side while in a flat bed without using bedrails?: A Little Help needed moving from lying on your back to sitting on the side of a flat bed without using bedrails?: A Little Help needed moving to and from a bed to a chair (including a wheelchair)?: A Little Help needed standing up from a chair using your arms (e.g., wheelchair or bedside chair)?: A Little Help needed to walk in hospital room?: A Lot Help needed climbing 3-5 steps with a railing? : A Lot 6 Click Score: 16    End of Session Equipment Utilized During Treatment: Gait belt Activity Tolerance: Patient tolerated treatment well Patient left: in bed;with call bell/phone within reach Nurse Communication: Mobility status PT Visit Diagnosis: Unsteadiness on feet (R26.81)     Time: 1000-1017 PT Time Calculation (min) (ACUTE ONLY): 17 min  Charges:  $Gait Training: 8-22 mins                      Lewis ShockAshly Minnette Merida, PT, DPT Acute Rehabilitation Services Pager #: [phone] Office #: [phone]    [name] Day 08/07/2018, 1:10 PM

## 2018-08-07 NOTE — Progress Notes (Signed)
Thank you for consult on [name]. Chart reviewed--note that patient in modified independent to supervision level. She is too high level for CIR and will defer consult.

## 2018-08-07 NOTE — Progress Notes (Signed)
Inpatient Rehabilitation Admissions Coordinator  Pt not in need of an intense inpt admit at this high level and does not have 24/7 assist. We concur that she needs SNF. I have updated RN CM and SW. We will sign off at this time.  Danne Baxter, RN, MSN Rehab Admissions Coordinator [phone] 08/07/2018 1:06 PM
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
## 2018-08-08 NOTE — TOC Initial Note (Signed)
Transition of Care [facility]) - Initial/Assessment Note    Patient Details  Name: [name] Day MRN: [id number] Date of Birth: [birth year]  Transition of Care [facility]) CM/SW Contact:    Pollie Friar, RN Phone Number: 08/08/2018, 11:02 AM  Clinical Narrative:                 TOC met with the patient yesterday. She was confused with some speech difficulties. TOC asked about who would assist her in d/c planning. She states her sister. TOC reached out to her sister and she says the contact is the patients son: [name] Day. CM called [name] Day and he agrees that he is the Media planner.  They are asking for rehab prior to home. TOC emailed him Medicare choice.  [name] Day states his daughter will provide care to the patient after a rehab stay.  This am TOC reached out to [place] and provide him the bed offer for his mother. He selected [place]. TOC reached out to [facility]. They will have a bed for her but are requesting a new Covid test since it has been 4 days since the last test. MD updated.  PASAR manual review sent in to [place] must.  TOC following.  Expected Discharge Plan: Skilled Nursing Facility Barriers to Discharge: Continued Medical Work up, Other (comment)(Covid test)   Patient Goals and CMS Choice   CMS Medicare.gov Compare Post Acute Care list provided to:: Patient Represenative (must comment) Choice offered to / list presented to : Adult Children  Expected Discharge Plan and Services Expected Discharge Plan: Skilled Nursing Facility In-house Referral: Clinical Social Work Discharge Planning Services: CM Consult Post Acute Care Choice: [place]                                        Prior Living Arrangements/Services   Lives with:: Self Patient language and need for interpreter reviewed:: Yes        Need for Family Participation in Patient Care: Yes (Comment) Care giver support system in place?: No (comment)   Criminal Activity/Legal Involvement  Pertinent to Current Situation/Hospitalization: No - Comment as needed  Activities of Daily Living      Permission Sought/Granted                  Emotional Assessment Appearance:: Appears stated age Attitude/Demeanor/Rapport: Engaged Affect (typically observed): Accepting(some confusion) Orientation: : Oriented to Self, Oriented to Place   Psych Involvement: No (comment)  Admission diagnosis:  sick Patient Active Problem List   Diagnosis Date Noted  . CVA (cerebral vascular accident) ([place]) 08/04/2018  . Tobacco abuse   . Hypothyroidism   . Schizoaffective disorder ([place])   . Hyperlipidemia    PCP:  Jonathon Jordan, MD Pharmacy:   [facility] [place], [place] [address] [place] [address] Phone: [phone] Fax: [phone]     Social Determinants of Health (SDOH) Interventions    Readmission Risk Interventions No flowsheet data found.

## 2018-08-08 NOTE — Progress Notes (Signed)
Physical Therapy Treatment Patient Details Name: [name] Day MRN: [id number] DOB: Nov 07, 1950 Today's Date: 08/08/2018    History of Present Illness [name] Day is an 68 y.o. female with schizoaffective disorder, current tobacco use, HLD, hypothyroidism presented to the ER on 6/26 with slurred speech x2-3 days. She denies any other symptoms. 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 On exam she has moderate dysarthria, no focal deficits. NIHSS 1.    PT Comments    Patient seen for mobility progression. Pt continues to present with impaired cognition and balance increasing risk for falls. Pt will continue to benefit from further skilled PT services to maximize independence and safety with mobility.     Follow Up Recommendations  SNF     Equipment Recommendations  None recommended by PT    Recommendations for Other Services       Precautions / Restrictions Precautions Precautions: Fall Restrictions Weight Bearing Restrictions: No    Mobility  Bed Mobility Overal bed mobility: Modified Independent Bed Mobility: Supine to Sit;Sit to Supine           General bed mobility comments: HOB elevated; increased effort   Transfers Overall transfer level: Needs assistance Equipment used: None Transfers: Sit to/from Stand Sit to Stand: Min guard         General transfer comment: min guard for safety  Ambulation/Gait Ambulation/Gait assistance: Min guard Gait Distance (Feet): 200 Feet Assistive device: None Gait Pattern/deviations: Step-through pattern;Decreased stride length;Staggering left;Drifts right/left Gait velocity: dec   General Gait Details: min guard for safety and min A with L horizontal head turns given increased unsteadiness/staggering; no overt LOB   Stairs   Stairs assistance: Min guard Stair Management: Two rails;Step to pattern;Forwards Number of Stairs: 2     Wheelchair Mobility    Modified Rankin (Stroke Patients  Only) Modified Rankin (Stroke Patients Only) Pre-Morbid Rankin Score: Slight disability Modified Rankin: Moderately severe disability     Balance Overall balance assessment: Needs assistance Sitting-balance support: No upper extremity supported Sitting balance-Leahy Scale: Good       Standing balance-Leahy Scale: Fair                   Standardized Balance Assessment Standardized Balance Assessment : Dynamic Gait Index   Dynamic Gait Index Level Surface: Mild Impairment Change in Gait Speed: Mild Impairment Gait with Horizontal Head Turns: Mild Impairment Gait with Vertical Head Turns: Mild Impairment Gait and Pivot Turn: Mild Impairment Step Over Obstacle: Moderate Impairment Step Around Obstacles: Mild Impairment Steps: Mild Impairment Total Score: 15      Cognition Arousal/Alertness: Awake/alert Behavior During Therapy: WFL for tasks assessed/performed Overall Cognitive Status: Impaired/Different from baseline Area of Impairment: Safety/judgement;Memory;Problem solving                     Memory: Decreased short-term memory   Safety/Judgement: Decreased awareness of safety;Decreased awareness of deficits   Problem Solving: Slow processing;Decreased initiation;Difficulty sequencing;Requires verbal cues;Requires tactile cues        Exercises      General Comments        Pertinent Vitals/Pain      Home Living                      Prior Function            PT Goals (current goals can now be found in the care plan section) Acute Rehab PT Goals Patient Stated Goal: go home Progress  towards PT goals: Progressing toward goals    Frequency    Min 4X/week      PT Plan Current plan remains appropriate    Co-evaluation              AM-PAC PT "6 Clicks" Mobility   Outcome Measure  Help needed turning from your back to your side while in a flat bed without using bedrails?: None Help needed moving from lying on your  back to sitting on the side of a flat bed without using bedrails?: A Little Help needed moving to and from a bed to a chair (including a wheelchair)?: A Little Help needed standing up from a chair using your arms (e.g., wheelchair or bedside chair)?: A Little Help needed to walk in hospital room?: A Little Help needed climbing 3-5 steps with a railing? : A Little 6 Click Score: 19    End of Session Equipment Utilized During Treatment: Gait belt Activity Tolerance: Patient tolerated treatment well Patient left: in bed;with call bell/phone within reach;with bed alarm set Nurse Communication: Mobility status PT Visit Diagnosis: Unsteadiness on feet (R26.81)     Time: 1100-1115 PT Time Calculation (min) (ACUTE ONLY): 15 min  Charges:  $Gait Training: 8-22 mins                     Earney Navy, PTA Acute Rehabilitation Services Pager: [phone] Office: [phone]     Darliss Cheney 08/08/2018, 11:26 AM

## 2018-08-08 NOTE — Progress Notes (Signed)
SLP Cancellation Note  Patient Details Name: [name] Day MRN: [id number] DOB: Jan 08, 1951   Cancelled treatment:       Reason Eval/Treat Not Completed: Patient at procedure or test/unavailable(Pt eating dinner and requested that tx be deferred until 7/1. SLP will follow up on subsequent date)  [name] I. [name], [place], [place] Office number [phone] Pager [phone]  [name] 08/08/2018, 5:12 PM

## 2018-08-09 DIAGNOSIS — I639 Cerebral infarction, unspecified: Secondary | ICD-10-CM

## 2018-08-09 LAB — NOVEL CORONAVIRUS, NAA (HOSP ORDER, SEND-OUT TO REF LAB; TAT 18-24 HRS): SARS-CoV-2, NAA: NOT DETECTED

## 2018-08-09 MED ORDER — SODIUM CHLORIDE 0.9 % IV SOLN
INTRAVENOUS | Status: DC
  Administered 2018-08-09 (×2): via INTRAVENOUS

## 2018-08-09 NOTE — Progress Notes (Signed)
Occupational Therapy Treatment Patient Details Name: [name] McalpineBrenda S Day MRN: [id number] DOB: 1950/08/25 Today's Date: 08/09/2018    History of present illness [name] McalpineBrenda S Herskowitz is an 68 y.o. female with schizoaffective disorder, current tobacco use, HLD, hypothyroidism presented to the ER on 6/26 with slurred speech x2-3 days. She denies any other symptoms. 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 On exam she has moderate dysarthria, no focal deficits. NIHSS 1.   OT comments  Focus of session today on ADL training sit<>stand at sink. Pt requiring supervision to min guard assist. HR to 115 with bathing. Pt continues to demonstrate impaired memory, problem solving, following multistep directions and awareness of safety/deficits. Pt reports feeling tired at end of session, opting to return to bed.  Follow Up Recommendations  SNF;Supervision/Assistance - 24 hour    Equipment Recommendations  None recommended by OT    Recommendations for Other Services      Precautions / Restrictions Precautions Precautions: Fall       Mobility Bed Mobility Overal bed mobility: Modified Independent             General bed mobility comments: HOB up  Transfers Overall transfer level: Needs assistance Equipment used: None Transfers: Sit to/from Stand Sit to Stand: Supervision         General transfer comment: supervision for safety/IV line    Balance Overall balance assessment: Needs assistance   Sitting balance-Leahy Scale: Good       Standing balance-Leahy Scale: Fair                             ADL either performed or assessed with clinical judgement   ADL Overall ADL's : Needs assistance/impaired     Grooming: Wash/dry hands;Wash/dry face;Sitting;Set up   Upper Body Bathing: Supervision/ safety;Sitting   Lower Body Bathing: Supervison/ safety;Sit to/from stand   Upper Body Dressing : Set up;Sitting       Toilet Transfer: Min  guard;Ambulation;Comfort height toilet   Toileting- Clothing Manipulation and Hygiene: Supervision/safety;Sit to/from stand       Functional mobility during ADLs: Min guard       Vision       Perception     Praxis      Cognition Arousal/Alertness: Awake/alert Behavior During Therapy: WFL for tasks assessed/performed Overall Cognitive Status: Impaired/Different from baseline Area of Impairment: Orientation;Safety/judgement;Memory;Problem solving;Following commands                 Orientation Level: Disoriented to;Time;Situation   Memory: Decreased short-term memory Following Commands: Follows one step commands consistently;Follows multi-step commands inconsistently Safety/Judgement: Decreased awareness of safety;Decreased awareness of deficits   Problem Solving: Slow processing;Requires verbal cues          Exercises     Shoulder Instructions       General Comments      Pertinent Vitals/ Pain       Pain Assessment: No/denies pain  Home Living                                          Prior Functioning/Environment              Frequency  Min 2X/week        Progress Toward Goals  OT Goals(current goals can now be found in the care plan section)  Progress towards OT goals:  Progressing toward goals  Acute Rehab OT Goals Patient Stated Goal: take a bath OT Goal Formulation: With patient Time For Goal Achievement: 08/21/18 Potential to Achieve Goals: Good  Plan Discharge plan remains appropriate    Co-evaluation                 AM-PAC OT "6 Clicks" Daily Activity     Outcome Measure   Help from another person eating meals?: None Help from another person taking care of personal grooming?: A Little Help from another person toileting, which includes using toliet, bedpan, or urinal?: A Little Help from another person bathing (including washing, rinsing, drying)?: A Little Help from another person to put on and  taking off regular upper body clothing?: A Little Help from another person to put on and taking off regular lower body clothing?: A Little 6 Click Score: 19    End of Session Equipment Utilized During Treatment: Gait belt  OT Visit Diagnosis: Other abnormalities of gait and mobility (R26.89);Other symptoms and signs involving cognitive function   Activity Tolerance Patient tolerated treatment well   Patient Left in bed;with call bell/phone within reach;with bed alarm set   Nurse Communication          Time: 1410-1438 OT Time Calculation (min): 28 min  Charges: OT General Charges $OT Visit: 1 Visit OT Treatments $Self Care/Home Management : 23-37 mins  Nestor Lewandowsky, OTR/L Acute Rehabilitation Services Pager: [phone] Office: [phone]   Malka So 08/09/2018, 2:46 PM
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
## 2018-08-09 NOTE — Progress Notes (Signed)
  Speech Language Pathology Treatment: Cognitive-Linquistic  Patient Details Name: [name] Day MRN: [id number] DOB: 16-Mar-1950 Today's Date: 08/09/2018 Time: 1335-1400 SLP Time Calculation (min) (ACUTE ONLY): 25 min  Assessment / Plan / Recommendation Clinical Impression  Pt was seen for treatment and was cooperative throughout the session. She reported today that she read the dysarthria handout which was provided last week. However, she was unable to recall any of the information from the handout, what it was related to, or the compensatory strategies which were provided during at the beginning of the session despite cues. Her cognition does appear worse today than that which was initially evaluated by speech pathology and cognitive-linguistic treatment will be initiated at this time. She demonstrated 60% accuracy with immediate 5-item recall increasing to 100% accuracy with min-mod cues. She achieved 100% accuracy with 4-item immediate recall. She demonstrated 60% accuracy with use of compensatory strategies for speech intelligibility at the word level increasing tro 100% accuracy with mod cues for overarticulation. At the phrase level she demonstrated 55% accuracy increasing to 100% with mod-max cues for rate and overticulation. SLP will continue to follow pt.    HPI HPI: Pt is a 68 y.o. female with medical history significant of HLD, schizoaffective disorder, and hypothyroidism; who presented with complaints of at least two days of slurred speech. 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  Possible chronic lacune in the posterior right lentiform. Patchy cerebral white matter signal changes which might also reflect chronic small vessel disease.      SLP Plan  Continue with current plan of care;Goals updated       Recommendations                   Follow up Recommendations: Other (comment)(TBD based on her progress) SLP  Visit Diagnosis: Cognitive communication deficit (R41.841);Dysarthria and anarthria (R47.1) Plan: Continue with current plan of care;Goals updated       Indie Boehne I. [name], [place], [place] Office number [phone] Pager [employer] 08/09/2018, 2:04 PM

## 2018-08-10 MED ORDER — ASPIRIN EC 81 MG PO TBEC: 81.0000 mg | DELAYED_RELEASE_TABLET | Freq: Every day | ORAL | Status: DC

## 2018-08-10 MED ORDER — CLOPIDOGREL BISULFATE 75 MG PO TABS: 75.0000 mg | ORAL_TABLET | Freq: Every day | ORAL | 3 refills | Status: AC

## 2018-08-10 NOTE — Discharge Summary (Signed)
Physician Discharge Summary  ZIAIRE BIESER UJW:[id number] DOB: Jul 26, 1950 DOA: 08/04/2018  PCP: Mila Palmer, MD  Admit date: 08/04/2018 Discharge date: 08/10/2018  Admitted From: home Disposition:  snf  Recommendations for Outpatient Follow-up:  1. Follow up with PCP in 1-2 weeks 2. Please obtain BMP/CBC in one week 3. Please follow up with [facility]  Home Health none Equipment/Devices none Discharge Condition stable and improved CODE STATUS: Full code Diet recommendation: Cardiac Brief/Interim Summary:68 y.o.right-hand-dominant femalewith medical history significant ofHLD, schizoaffective disorder, and hypothyroidism; who presents with complaints of at least 2 days of slurred speech. History mostly obtained from the patient from home history is somewhat limited due to speech. Notes of some possible weakness, but does not necessarily localize it to 1 specific side. She reports previous injury with a Q-tip causing some discoloration of the right side of her face. Associated symptoms include some shortness of breath. Patient reports smoking for at least 20 some years approximately 1 pack a day on average but quit in 1 to 2 months ago. Denies having any palpitations, chest pain, nausea, vomiting, cough, dysuria, or recent falls. She is on daily aspirin.  ED Course:Upon admission into the emergency department patient was noted to be afebrile, pulse 51-54, and all other vital signs maintained. Labs revealed hemoglobin 11.4, and labs otherwise were noted to be within normal limits. MRI of the brain revealed acute medial left lentiformand/orinternal capsule infarcts. Neurology was consultedby the ED provider.TRH called to admit   Discharge Diagnoses:  Principal Problem:   CVA (cerebral vascular accident) [facility]) Active Problems:   Tobacco abuse   Hypothyroidism   Schizoaffective disorder (HCC)   Hyperlipidemia   Cerebral infarction (HCC)  acute left lacunar  infarct-patient admitted with slurred speechand dysarthria.MRI shows acute lacunar infarct in the medial left lentiform and/or internal capsule. Possible chronic lacunar in the posterior right lentiform. Work-up shows LDL of 90, hemoglobin A1c of 6.2, TSH 3.2,CUS no blockage.  Patient was started on aspirin statin and Plavix.  Continue aspirin and Plavix for 3 weeks and then continue only Plavix  -CT angiogram shows mild intracranial atherosclerosis without large vessel occlusion or significant stenosis, widely patent cervical carotid and vertebral arteries.  COVID negative  -echoThe left ventricle has normal systolic function with an ejection fraction of 60-65%. The cavity size was normal. Left ventricular diastolic Doppler parameters are consistent with impaired relaxation. The right ventricle has normal systolic function. The cavity was normal. There is no increase in right ventricular wall thickness. Left atrial size was mild-moderately dilated. No evidence of mitral valve stenosis. The aortic valve is tricuspid. No stenosis of the aortic valve. The aortic root is normal in size and structure. Pulmonary hypertension is indeterminant, inadequate TR jet. The interatrial septum was not well visualized.  Schizoaffective disorder -Continue Celexa and olanzapine  Hypothyroidism -Continue levothyroxine  Hyperlipidemia: At home patient on lovastatin 40 mg daily -Changedto atorvastatin 80 mg daily  Mild hyperglycemia with a hemoglobin A1c of 6.2 no history ofdiabetes. Will need outpatient follow-up.  Tobacco abuse: Patient reports smoking 1 pack of cigarettes per day on average for over 20 years, but reported quitting 1 to 2 months ago. -Encouraged continued cessation of tobacco use   Estimated body mass index is 27.59 kg/m as calculated from the following:   Height as of this encounter:  (1.549 m).   Weight as of this encounter: 66.2 kg.  Discharge  Instructions  Discharge Instructions    Ambulatory referral to Neurology   Complete by:  As directed    Follow up with stroke clinic NP (Jessica Wauconda or Cecille Rubin, if both not available, consider Zachery Dauer, or Ahern) at [facility] in about 4 weeks. Thanks.   Call MD for:  difficulty breathing, headache or visual disturbances   Complete by: As directed    Call MD for:  persistant nausea and vomiting   Complete by: As directed    Diet - low sodium heart healthy   Complete by: As directed    Increase activity slowly   Complete by: As directed      Allergies as of 08/10/2018   No Known Allergies     Medication List    STOP taking these medications   lovastatin 40 MG tablet Commonly known as: MEVACOR     TAKE these medications   aspirin EC 81 MG tablet Take 1 tablet (81 mg total) by mouth daily. Continue aspirin and Plavix together for 3 weeks then stop the aspirin and continue only Plavix What changed: additional instructions   atorvastatin 80 MG tablet Commonly known as: LIPITOR Take 1 tablet (80 mg total) by mouth daily at 6 PM.   CALCIUM 1200 PO Take 1 tablet by mouth daily.   cholecalciferol 25 MCG (1000 UT) tablet Commonly known as: VITAMIN D Take 1,000 Units by mouth daily.   citalopram 20 MG tablet Commonly known as: CELEXA Take 20 mg by mouth daily.   clopidogrel 75 MG tablet Commonly known as: PLAVIX Take 1 tablet (75 mg total) by mouth daily.   levothyroxine 50 MCG tablet Commonly known as: SYNTHROID Take 50 mcg by mouth daily before breakfast.   OLANZapine 10 MG tablet Commonly known as: ZYPREXA Take 10 mg by mouth daily.   traZODone 50 MG tablet Commonly known as: DESYREL Take 50 mg by mouth at bedtime.       Contact information for follow-up providers    Jonathon Jordan, MD Follow up.   Specialty: Family Medicine Contact information: [place] [address] [place]. Schedule an appointment as soon as possible for a visit in 4 week(s).   Contact information: [address]     [address] [place] [address] [phone]           Contact information for after-discharge care    Destination    [facility] .   Service: Skilled Nursing Contact information: [address] [place] [phone]                 No Known Allergies  Consultations:  neuro   Procedures/Studies: Ct Angio Head W Or Wo Contrast  Result Date: 08/05/2018 CLINICAL DATA:  Stroke workup. EXAM: CT ANGIOGRAPHY HEAD AND NECK TECHNIQUE: Multidetector CT imaging of the head and neck was performed using the standard protocol during bolus administration of intravenous contrast. Multiplanar CT image reconstructions and MIPs were obtained to evaluate the vascular anatomy. Carotid stenosis measurements (when applicable) are obtained utilizing NASCET criteria, using the distal internal carotid diameter as the denominator. CONTRAST:  59mL OMNIPAQUE IOHEXOL 350 MG/ML SOLN COMPARISON:  Brain MRI 08/04/2018 FINDINGS: CT HEAD FINDINGS Brain: Focal hypoattenuation in the posterior limb of the left internal capsule and adjacent left lentiform nucleus corresponds to the acute infarct on MRI. Scattered hypodensities elsewhere in the cerebral white matter bilaterally are nonspecific but compatible with mild chronic small vessel ischemic disease. The ventricles and sulci  are normal. Vascular: Calcified atherosclerosis at the skull base. No hyperdense vessel. Skull: No fracture or focal osseous lesion. Sinuses: Clear. Orbits: Unremarkable. Review of the MIP images confirms the above findings CTA NECK FINDINGS Aortic arch: Normal variant aortic arch branching pattern with common origin of the brachiocephalic and left common carotid arteries. Widely patent arch vessel origins. Right carotid system: Patent  without evidence of dissection or stenosis. Tortuous proximal ICA. Left carotid system: Patent without evidence of dissection or stenosis. Vertebral arteries: Patent without evidence of dissection or stenosis. Mildly dominant right vertebral artery. Skeleton: No suspicious osseous lesion. Other neck: No evidence of cervical lymphadenopathy or mass. Upper chest: Mild emphysema. Review of the MIP images confirms the above findings CTA HEAD FINDINGS Anterior circulation: The internal carotid arteries are patent from skull base to carotid termini with mild atherosclerotic plaque bilaterally not resulting in significant stenosis. ACAs and MCAs are patent without evidence of proximal branch occlusion or significant proximal stenosis. No aneurysm is identified. Posterior circulation: The intracranial vertebral arteries are widely patent to the basilar. The basilar artery is widely patent. Posterior communicating arteries are diminutive or absent. PCAs are patent without evidence of significant proximal stenosis. No aneurysm is identified. Venous sinuses: As permitted by contrast timing, patent. Anatomic variants: None. Review of the MIP images confirms the above findings IMPRESSION: 1. Mild intracranial atherosclerosis without large vessel occlusion or significant stenosis. 2. Widely patent cervical carotid and vertebral arteries. Electronically Signed   By: Sebastian Ache M.D.   On: 08/05/2018 17:40   Ct Head Wo Contrast  Result Date: 08/04/2018 CLINICAL DATA:  Slurred speech for 2 days EXAM: CT HEAD WITHOUT CONTRAST TECHNIQUE: Contiguous axial images were obtained from the base of the skull through the vertex without intravenous contrast. COMPARISON:  None. FINDINGS: Brain: No evidence of acute infarction, hemorrhage, hydrocephalus, extra-axial collection or mass lesion/mass effect. Vascular: No hyperdense vessel or unexpected calcification. Skull: No osseous abnormality. Sinuses/Orbits: Visualized paranasal sinuses  are clear. Visualized mastoid sinuses are clear. Visualized orbits demonstrate no focal abnormality. Other: None IMPRESSION: No acute intracranial pathology. Electronically Signed   By: Elige Ko   On: 08/04/2018 10:53   Ct Angio Neck W Or Wo Contrast  Result Date: 08/05/2018 CLINICAL DATA:  Stroke workup. EXAM: CT ANGIOGRAPHY HEAD AND NECK TECHNIQUE: Multidetector CT imaging of the head and neck was performed using the standard protocol during bolus administration of intravenous contrast. Multiplanar CT image reconstructions and MIPs were obtained to evaluate the vascular anatomy. Carotid stenosis measurements (when applicable) are obtained utilizing NASCET criteria, using the distal internal carotid diameter as the denominator. CONTRAST:  50mL OMNIPAQUE IOHEXOL 350 MG/ML SOLN COMPARISON:  Brain MRI 08/04/2018 FINDINGS: CT HEAD FINDINGS Brain: Focal hypoattenuation in the posterior limb of the left internal capsule and adjacent left lentiform nucleus corresponds to the acute infarct on MRI. Scattered hypodensities elsewhere in the cerebral white matter bilaterally are nonspecific but compatible with mild chronic small vessel ischemic disease. The ventricles and sulci are normal. Vascular: Calcified atherosclerosis at the skull base. No hyperdense vessel. Skull: No fracture or focal osseous lesion. Sinuses: Clear. Orbits: Unremarkable. Review of the MIP images confirms the above findings CTA NECK FINDINGS Aortic arch: Normal variant aortic arch branching pattern with common origin of the brachiocephalic and left common carotid arteries. Widely patent arch vessel origins. Right carotid system: Patent without evidence of dissection or stenosis. Tortuous proximal ICA. Left carotid system: Patent without evidence of dissection or stenosis. Vertebral arteries: Patent without evidence of  dissection or stenosis. Mildly dominant right vertebral artery. Skeleton: No suspicious osseous lesion. Other neck: No evidence  of cervical lymphadenopathy or mass. Upper chest: Mild emphysema. Review of the MIP images confirms the above findings CTA HEAD FINDINGS Anterior circulation: The internal carotid arteries are patent from skull base to carotid termini with mild atherosclerotic plaque bilaterally not resulting in significant stenosis. ACAs and MCAs are patent without evidence of proximal branch occlusion or significant proximal stenosis. No aneurysm is identified. Posterior circulation: The intracranial vertebral arteries are widely patent to the basilar. The basilar artery is widely patent. Posterior communicating arteries are diminutive or absent. PCAs are patent without evidence of significant proximal stenosis. No aneurysm is identified. Venous sinuses: As permitted by contrast timing, patent. Anatomic variants: None. Review of the MIP images confirms the above findings IMPRESSION: 1. Mild intracranial atherosclerosis without large vessel occlusion or significant stenosis. 2. Widely patent cervical carotid and vertebral arteries. Electronically Signed   By: Sebastian AcheAllen  Grady M.D.   On: 08/05/2018 17:40   Mr Brain Wo Contrast (neuro Protocol)  Result Date: 08/04/2018 CLINICAL DATA:  68 year old female with slurred speech. EXAM: MRI HEAD WITHOUT CONTRAST TECHNIQUE: Multiplanar, multiecho pulse sequences of the brain and surrounding structures were obtained without intravenous contrast. COMPARISON:  Head CT earlier today. FINDINGS: Brain: Linear 15 millimeter area of restricted diffusion in the medial left lentiform and/or posterior limb left internal capsule (series 5, image 72). Faint associated T2 and FLAIR hyperintensity. No associated hemorrhage or mass effect. No other restricted diffusion. No midline shift, mass effect, evidence of mass lesion, ventriculomegaly, extra-axial collection or acute intracranial hemorrhage. Cervicomedullary junction and pituitary are within normal limits. Scattered and patchy cerebral white matter  T2 and FLAIR hyperintensity, mostly in the periatrial regions. No cortical encephalomalacia or chronic cerebral blood products. Possible chronic lacunar infarct in the posterior right lentiform. Minor T2 heterogeneity in the pons. Negative cerebellum. Vascular: Major intracranial vascular flow voids are preserved. Skull and upper cervical spine: Negative visible cervical spine. Visualized bone marrow signal is within normal limits. Sinuses/Orbits: Questionable mild proptosis, thickening of the bilateral extraocular muscles and/or increased intraorbital fat. Paranasal sinuses and mastoids are stable and well pneumatized. Other: Grossly normal visible internal auditory structures. Scalp and face soft tissues appear negative. IMPRESSION: 1. Acute lacunar infarct in the medial left lentiform and/or internal capsule. No associated hemorrhage or mass effect. 2. Possible chronic lacune in the posterior right lentiform. Patchy cerebral white matter signal changes which might also reflect chronic small vessel disease. 3. Questionable exophthalmos, consider Thyroid Eye Disease. Electronically Signed   By: Odessa FlemingH  Hall M.D.   On: 08/04/2018 12:54   [facility] [place] [facility])  Result Date: 08/07/2018 Carotid Arterial Duplex Study Indications:       CVA. Risk Factors:      Hypertension, hyperlipidemia, current smoker. Comparison Study:  no prior Performing Technologist: Jeb LeveringJill Parker RDMS, RVT  Examination Guidelines: A complete evaluation includes B-mode imaging, spectral Doppler, color Doppler, and power Doppler as needed of all accessible portions of each vessel. Bilateral testing is considered an integral part of a complete examination. Limited examinations for reoccurring indications may be performed as noted.  Right Carotid Findings: +----------+--------+--------+--------+------------+--------+           PSV cm/sEDV cm/sStenosisDescribe    Comments +----------+--------+--------+--------+------------+--------+  CCA Prox  93      19                                   +----------+--------+--------+--------+------------+--------+  CCA Distal64      14                                   +----------+--------+--------+--------+------------+--------+ ICA Prox  54      13      1-39%   heterogenous         +----------+--------+--------+--------+------------+--------+ ICA Mid   105     30                          tortuous +----------+--------+--------+--------+------------+--------+ ICA Distal126     33                          tortuous +----------+--------+--------+--------+------------+--------+ ECA       40      6                                    +----------+--------+--------+--------+------------+--------+ +----------+--------+-------+----------------+-------------------+           PSV cm/sEDV cmsDescribe        Arm Pressure (mmHG) +----------+--------+-------+----------------+-------------------+ Subclavian110            Multiphasic, WNL                    +----------+--------+-------+----------------+-------------------+ +---------+--------+--+--------+--+---------+ VertebralPSV cm/s54EDV cm/s24Antegrade +---------+--------+--+--------+--+---------+  Left Carotid Findings: +----------+--------+--------+--------+-----------+--------+           PSV cm/sEDV cm/sStenosisDescribe   Comments +----------+--------+--------+--------+-----------+--------+ CCA Prox  103     18                                  +----------+--------+--------+--------+-----------+--------+ CCA Distal42      10                                  +----------+--------+--------+--------+-----------+--------+ ICA Prox  58      12      1-39%   homogeneous         +----------+--------+--------+--------+-----------+--------+ ICA Mid   161     35                         tortuous +----------+--------+--------+--------+-----------+--------+ ICA Distal152     47                          tortuous +----------+--------+--------+--------+-----------+--------+ ECA       41      7                                   +----------+--------+--------+--------+-----------+--------+ +----------+--------+--------+----------------+-------------------+ SubclavianPSV cm/sEDV cm/sDescribe        Arm Pressure (mmHG) +----------+--------+--------+----------------+-------------------+           176             Multiphasic, WNL                    +----------+--------+--------+----------------+-------------------+ +---------+--------+--+--------+--+---------+ VertebralPSV cm/s47EDV cm/s11Antegrade +---------+--------+--+--------+--+---------+  Summary: Right Carotid: Velocities in the right ICA are consistent with a 1-39% stenosis. Left Carotid: Velocities in the left ICA are consistent with  a 1-39% stenosis. Vertebrals:  Bilateral vertebral arteries demonstrate antegrade flow. Subclavians: Normal flow hemodynamics were seen in bilateral subclavian              arteries. *See table(s) above for measurements and observations.  Electronically signed by Delia Heady MD on 08/07/2018 at 9:29:58 AM.    Final    (Echo, Carotid, EGD, Colonoscopy, ERCP)    Subjective: Patient resting in bed no new changes  Discharge Exam: Vitals:   08/10/18 0408 08/10/18 0803  BP: 105/66 (!) 114/54  Pulse: 66 (!) 56  Resp: 18 15  Temp: 98 F (36.7 C) 98.6 F (37 C)  SpO2: 99% 98%   Vitals:   08/09/18 1933 08/10/18 0010 08/10/18 0408 08/10/18 0803  BP: 118/74 90/61 105/66 (!) 114/54  Pulse: 69 71 66 (!) 56  Resp: Temp: 98.2 F (36.8 C) (!) 97.5 F (36.4 C) 98 F (36.7 C) 98.6 F (37 C)  TempSrc: Oral Oral Oral Oral  SpO2: 97% 92% 99% 98%  Weight:      Height:        General: Pt is alert, awake, not in acute distress Cardiovascular: RRR, S1/S2 +, no rubs, no gallops Respiratory: CTA bilaterally, no wheezing, no rhonchi Abdominal: Soft, NT, ND, bowel sounds  + Extremities: no edema, no cyanosis    The results of significant diagnostics from this hospitalization (including imaging, microbiology, ancillary and laboratory) are listed below for reference.     Microbiology: Recent Results (from the past 240 hour(s))  SARS Coronavirus 2 (CEPHEID - Performed in [facility] hospital lab), Hosp Order     Status: None   Collection Time: 08/04/18  1:50 PM   Specimen: Nasopharyngeal Swab  Result Value Ref Range Status   SARS Coronavirus 2 NEGATIVE NEGATIVE Final    Comment: (NOTE) If result is NEGATIVE SARS-CoV-2 target nucleic acids are NOT DETECTED. The SARS-CoV-2 RNA is generally detectable in upper and lower  respiratory specimens during the acute phase of infection. The lowest  concentration of SARS-CoV-2 viral copies this assay can detect is 250  copies / mL. A negative result does not preclude SARS-CoV-2 infection  and should not be used as the sole basis for treatment or other  patient management decisions.  A negative result may occur with  improper specimen collection / handling, submission of specimen other  than nasopharyngeal swab, presence of viral mutation(s) within the  areas targeted by this assay, and inadequate number of viral copies  (<250 copies / mL). A negative result must be combined with clinical  observations, patient history, and epidemiological information. If result is POSITIVE SARS-CoV-2 target nucleic acids are DETECTED. The SARS-CoV-2 RNA is generally detectable in upper and lower  respiratory specimens dur ing the acute phase of infection.  Positive  results are indicative of active infection with SARS-CoV-2.  Clinical  correlation with patient history and other diagnostic information is  necessary to determine patient infection status.  Positive results do  not rule out bacterial infection or co-infection with other viruses. If result is PRESUMPTIVE POSTIVE SARS-CoV-2 nucleic acids MAY BE PRESENT.   A  presumptive positive result was obtained on the submitted specimen  and confirmed on repeat testing.  While 2019 novel coronavirus  (SARS-CoV-2) nucleic acids may be present in the submitted sample  additional confirmatory testing may be necessary for epidemiological  and / or clinical management purposes  to differentiate between  SARS-CoV-2 and other Sarbecovirus currently known to infect humans.  If clinically indicated additional testing with an alternate test  methodology [phone]) is advised. The SARS-CoV-2 RNA is generally  detectable in upper and lower respiratory sp ecimens during the acute  phase of infection. The expected result is Negative. Fact Sheet for Patients:  BoilerBrush.com.cy Fact Sheet for Healthcare Providers: https://pope.com/ This test is not yet approved or cleared by the [place] FDA and has been authorized for detection and/or diagnosis of SARS-CoV-2 by FDA under an Emergency Use Authorization (EUA).  This EUA will remain in effect (meaning this test can be used) for the duration of the COVID-19 declaration under Section 564(b)(1) of the Act, 21 U.S.C. section 360bbb-3(b)(1), unless the authorization is terminated or revoked sooner. Performed at [facility], [address] [place], [place] [address]   Novel Coronavirus, NAA (hospital order; send-out to ref lab)     Status: None   Collection Time: 08/08/18 10:57 AM   Specimen: Nasopharyngeal Swab; Respiratory  Result Value Ref Range Status   SARS-CoV-2, NAA NOT DETECTED NOT DETECTED Final    Comment: (NOTE) This test was developed and its performance characteristics determined by [employer]. This test has not been FDA cleared or approved. This test has been authorized by FDA under an Emergency Use Authorization (EUA). This test is only authorized for the duration of time the declaration that circumstances exist justifying the  authorization of the emergency use of in vitro diagnostic tests for detection of SARS-CoV-2 virus and/or diagnosis of COVID-19 infection under section 564(b)(1) of the Act, 21 U.S.C. 811BJY-7(W)(2), unless the authorization is terminated or revoked sooner. When diagnostic testing is negative, the possibility of a false negative result should be considered in the context of a patient's recent exposures and the presence of clinical signs and symptoms consistent with COVID-19. An individual without symptoms of COVID-19 and who is not shedding SARS-CoV-2 virus would expect to have a negative (not detected) result in this assay. Performed  At: [facility] [address] [place], [place] [id number] Jolene Schimke MD VH:[id number]    Coronavirus Source NASOPHARYNGEAL  Final    Comment: Performed at [facility], [address], [place], [place] [address]     Labs: BNP (last 3 results) No results for input(s): BNP in the last 8760 hours. Basic Metabolic Panel: Recent Labs  Lab 08/04/18 1109 08/05/18 0353  NA 144 141  K 3.9 3.5  CL 108 103  CO2 29 27  GLUCOSE 92 87  BUN 12 12  CREATININE 0.98 0.82  CALCIUM 9.6 9.4   Liver Function Tests: Recent Labs  Lab 08/04/18 1109  AST 20  ALT 22  ALKPHOS 80  BILITOT 0.6  PROT 6.5  ALBUMIN 3.4*   No results for input(s): LIPASE, AMYLASE in the last 168 hours. No results for input(s): AMMONIA in the last 168 hours. CBC: Recent Labs  Lab 08/04/18 1109 08/05/18 0353  WBC 7.1 6.7  NEUTROABS 4.7 3.9  HGB 11.4* 11.9*  HCT 36.9 38.0  MCV 82.9 81.9  PLT 204 185   Cardiac Enzymes: No results for input(s): CKTOTAL, CKMB, CKMBINDEX, TROPONINI in the last 168 hours. BNP: Invalid input(s): POCBNP CBG: Recent Labs  Lab 08/04/18 1017  GLUCAP 89   D-Dimer No results for input(s): DDIMER in the last 72 hours. Hgb A1c No results for input(s): HGBA1C in the last 72 hours. Lipid Profile No results for input(s): CHOL,  HDL, LDLCALC, TRIG, CHOLHDL, LDLDIRECT in the last 72 hours. Thyroid function studies No results for  input(s): TSH, T4TOTAL, T3FREE, THYROIDAB in the last 72 hours.  Invalid input(s): FREET3 Anemia work up No results for input(s): VITAMINB12, FOLATE, FERRITIN, TIBC, IRON, RETICCTPCT in the last 72 hours. Urinalysis    Component Value Date/Time   COLORURINE YELLOW 08/04/2018 2020   APPEARANCEUR HAZY (A) 08/04/2018 2020   LABSPEC 1.016 08/04/2018 2020   PHURINE 7.0 08/04/2018 2020   GLUCOSEU NEGATIVE 08/04/2018 2020   HGBUR NEGATIVE 08/04/2018 2020   BILIRUBINUR NEGATIVE 08/04/2018 2020   KETONESUR NEGATIVE 08/04/2018 2020   PROTEINUR NEGATIVE 08/04/2018 2020   UROBILINOGEN 1.0 04/24/2009 2001   NITRITE NEGATIVE 08/04/2018 2020   LEUKOCYTESUR NEGATIVE 08/04/2018 2020   Sepsis Labs Invalid input(s): PROCALCITONIN,  WBC,  LACTICIDVEN Microbiology Recent Results (from the past 240 hour(s))  SARS Coronavirus 2 (CEPHEID - Performed in [facility] hospital lab), Hosp Order     Status: None   Collection Time: 08/04/18  1:50 PM   Specimen: Nasopharyngeal Swab  Result Value Ref Range Status   SARS Coronavirus 2 NEGATIVE NEGATIVE Final    Comment: (NOTE) If result is NEGATIVE SARS-CoV-2 target nucleic acids are NOT DETECTED. The SARS-CoV-2 RNA is generally detectable in upper and lower  respiratory specimens during the acute phase of infection. The lowest  concentration of SARS-CoV-2 viral copies this assay can detect is 250  copies / mL. A negative result does not preclude SARS-CoV-2 infection  and should not be used as the sole basis for treatment or other  patient management decisions.  A negative result may occur with  improper specimen collection / handling, submission of specimen other  than nasopharyngeal swab, presence of viral mutation(s) within the  areas targeted by this assay, and inadequate number of viral copies  (<250 copies / mL). A negative result must be combined  with clinical  observations, patient history, and epidemiological information. If result is POSITIVE SARS-CoV-2 target nucleic acids are DETECTED. The SARS-CoV-2 RNA is generally detectable in upper and lower  respiratory specimens dur ing the acute phase of infection.  Positive  results are indicative of active infection with SARS-CoV-2.  Clinical  correlation with patient history and other diagnostic information is  necessary to determine patient infection status.  Positive results do  not rule out bacterial infection or co-infection with other viruses. If result is PRESUMPTIVE POSTIVE SARS-CoV-2 nucleic acids MAY BE PRESENT.   A presumptive positive result was obtained on the submitted specimen  and confirmed on repeat testing.  While 2019 novel coronavirus  (SARS-CoV-2) nucleic acids may be present in the submitted sample  additional confirmatory testing may be necessary for epidemiological  and / or clinical management purposes  to differentiate between  SARS-CoV-2 and other Sarbecovirus currently known to infect humans.  If clinically indicated additional testing with an alternate test  methodology [phone]([id number]) is advised. The SARS-CoV-2 RNA is generally  detectable in upper and lower respiratory sp ecimens during the acute  phase of infection. The expected result is Negative. Fact Sheet for Patients:  BoilerBrush.com.cyhttps://www.fda.gov/media/136312/download Fact Sheet for Healthcare Providers: https://pope.com/https://www.fda.gov/media/136313/download This test is not yet approved or cleared by the [place] FDA and has been authorized for detection and/or diagnosis of SARS-CoV-2 by FDA under an Emergency Use Authorization (EUA).  This EUA will remain in effect (meaning this test can be used) for the duration of the COVID-19 declaration under Section 564(b)(1) of the Act, 21 U.S.C. section 360bbb-3(b)(1), unless the authorization is terminated or revoked sooner. Performed at [facility], [address]  [place], [place] [address]   Novel Coronavirus, NAA (hospital order; send-out to ref lab)     Status: None   Collection Time: 08/08/18 10:57 AM   Specimen: Nasopharyngeal Swab; Respiratory  Result Value Ref Range Status   SARS-CoV-2, NAA NOT DETECTED NOT DETECTED Final    Comment: (NOTE) This test was developed and its performance characteristics determined by [facility]. This test has not been FDA cleared or approved. This test has been authorized by FDA under an Emergency Use Authorization (EUA). This test is only authorized for the duration of time the declaration that circumstances exist justifying the authorization of the emergency use of in vitro diagnostic tests for detection of SARS-CoV-2 virus and/or diagnosis of COVID-19 infection under section 564(b)(1) of the Act, 21 U.S.C. 191YNW-2(N)(5360bbb-3(b)(1), unless the authorization is terminated or revoked sooner. When diagnostic testing is negative, the possibility of a false negative result should be considered in the context of a patient's recent exposures and the presence of clinical signs and symptoms consistent with COVID-19. An individual without symptoms of COVID-19 and who is not shedding SARS-CoV-2 virus would expect to have a negative (not detected) result in this assay. Performed  At: [facility] [address] [place], [place] [id number] Jolene SchimkeNagendra Sanjai MD QI:[id number]:[phone]    Coronavirus Source NASOPHARYNGEAL  Final    Comment: Performed at [facility], [address] [place], [place] [address]     Time coordinating discharge: 33  minutes  SIGNED:   Alwyn RenElizabeth G , MD  [facility] 08/10/2018, 9:13 AM Pager   If 7PM-7AM, please contact night-coverage [email or web address] Password [email or web address]

## 2018-08-10 NOTE — Progress Notes (Signed)
Report given to Happy at [facility]

## 2018-08-10 NOTE — TOC Transition Note (Signed)
Transition of Care [facility]) - CM/SW Discharge Note   Patient Details  Name: [name] Day MRN: [id number] Date of Birth: October 10, 1950  [facility]) CM/SW Contact:  Eileen Stanford, LCSW Phone Number: 08/10/2018, 11:39 AM   Clinical Narrative:   Clinical Social Worker facilitated patient discharge including contacting patient family and facility to confirm patient discharge plans.  Clinical information faxed to facility and family agreeable with plan.  CSW arranged ambulance transport via [facility] to [facility] (room 126).  RN to call [phone] for report prior to discharge.    Final next level of care: Skilled Nursing Facility Barriers to Discharge: No Barriers Identified   Patient Goals and CMS Choice   CMS Medicare.gov Compare Post Acute Care list provided to:: Patient Represenative (must comment) Choice offered to / list presented to : Adult Children  Discharge Placement              Patient chooses bed at: [facility]) Patient to be transferred to facility by: [facility] Name of family member notified: Nicole Kindred Patient and family notified of of transfer: 08/10/18  Discharge Plan and Services In-house Referral: Clinical Social Work Discharge Planning Services: CM Consult Post Acute Care Choice: [place]                               Social Determinants of Health (SDOH) Interventions     Readmission Risk Interventions No flowsheet data found.

## 2018-08-10 NOTE — Plan of Care (Signed)
  Problem: Education: Goal: Knowledge of General Education information will improve Description: Including pain rating scale, medication(s)/side effects and non-pharmacologic comfort measures 08/10/2018 0247 by Carin Hock I, RN Outcome: Progressing 08/10/2018 0247 by Marcos Eke, RN Outcome: Progressing

## 2018-08-10 NOTE — Progress Notes (Signed)
[facility] arrived and picked up pt to tranpsort to facility. No new questions or concerns. IV and tele removed, D/C summary provided.

## 2018-08-14 DIAGNOSIS — I639 Cerebral infarction, unspecified: Secondary | ICD-10-CM | POA: Diagnosis not present

## 2018-08-14 DIAGNOSIS — E785 Hyperlipidemia, unspecified: Secondary | ICD-10-CM | POA: Diagnosis not present

## 2018-08-14 DIAGNOSIS — F259 Schizoaffective disorder, unspecified: Secondary | ICD-10-CM | POA: Diagnosis not present

## 2018-08-14 DIAGNOSIS — R4701 Aphasia: Secondary | ICD-10-CM | POA: Diagnosis not present

## 2018-08-22 DIAGNOSIS — N949 Unspecified condition associated with female genital organs and menstrual cycle: Secondary | ICD-10-CM | POA: Diagnosis not present

## 2018-08-22 DIAGNOSIS — I639 Cerebral infarction, unspecified: Secondary | ICD-10-CM | POA: Diagnosis not present

## 2018-08-22 DIAGNOSIS — R2232 Localized swelling, mass and lump, left upper limb: Secondary | ICD-10-CM | POA: Diagnosis not present

## 2018-08-22 DIAGNOSIS — R4701 Aphasia: Secondary | ICD-10-CM | POA: Diagnosis not present

## 2018-08-23 DIAGNOSIS — D649 Anemia, unspecified: Secondary | ICD-10-CM | POA: Diagnosis not present

## 2018-08-23 DIAGNOSIS — R6889 Other general symptoms and signs: Secondary | ICD-10-CM | POA: Diagnosis not present

## 2018-08-25 DIAGNOSIS — R112 Nausea with vomiting, unspecified: Secondary | ICD-10-CM | POA: Diagnosis not present

## 2018-08-25 DIAGNOSIS — I639 Cerebral infarction, unspecified: Secondary | ICD-10-CM | POA: Diagnosis not present

## 2018-08-25 DIAGNOSIS — R0989 Other specified symptoms and signs involving the circulatory and respiratory systems: Secondary | ICD-10-CM | POA: Diagnosis not present

## 2018-08-25 DIAGNOSIS — R4701 Aphasia: Secondary | ICD-10-CM | POA: Diagnosis not present

## 2018-08-25 DIAGNOSIS — R2232 Localized swelling, mass and lump, left upper limb: Secondary | ICD-10-CM | POA: Diagnosis not present

## 2018-09-05 DIAGNOSIS — I639 Cerebral infarction, unspecified: Secondary | ICD-10-CM | POA: Diagnosis not present

## 2018-09-05 DIAGNOSIS — R2232 Localized swelling, mass and lump, left upper limb: Secondary | ICD-10-CM | POA: Diagnosis not present

## 2018-09-05 DIAGNOSIS — R4701 Aphasia: Secondary | ICD-10-CM | POA: Diagnosis not present

## 2018-09-05 DIAGNOSIS — E785 Hyperlipidemia, unspecified: Secondary | ICD-10-CM | POA: Diagnosis not present

## 2018-09-08 DIAGNOSIS — I639 Cerebral infarction, unspecified: Secondary | ICD-10-CM | POA: Diagnosis not present

## 2018-09-08 DIAGNOSIS — E039 Hypothyroidism, unspecified: Secondary | ICD-10-CM | POA: Diagnosis not present

## 2018-09-08 DIAGNOSIS — E785 Hyperlipidemia, unspecified: Secondary | ICD-10-CM | POA: Diagnosis not present

## 2018-09-08 DIAGNOSIS — R4701 Aphasia: Secondary | ICD-10-CM | POA: Diagnosis not present

## 2018-09-11 ENCOUNTER — Other Ambulatory Visit: Payer: Self-pay | Admitting: Family Medicine

## 2018-09-11 DIAGNOSIS — Z1231 Encounter for screening mammogram for malignant neoplasm of breast: Secondary | ICD-10-CM

## 2018-09-12 DIAGNOSIS — E039 Hypothyroidism, unspecified: Secondary | ICD-10-CM | POA: Diagnosis not present

## 2018-09-12 DIAGNOSIS — I69320 Aphasia following cerebral infarction: Secondary | ICD-10-CM | POA: Diagnosis not present

## 2018-09-12 DIAGNOSIS — R2689 Other abnormalities of gait and mobility: Secondary | ICD-10-CM | POA: Diagnosis not present

## 2018-09-12 DIAGNOSIS — I69392 Facial weakness following cerebral infarction: Secondary | ICD-10-CM | POA: Diagnosis not present

## 2018-09-12 DIAGNOSIS — F329 Major depressive disorder, single episode, unspecified: Secondary | ICD-10-CM | POA: Diagnosis not present

## 2018-09-12 DIAGNOSIS — F17210 Nicotine dependence, cigarettes, uncomplicated: Secondary | ICD-10-CM | POA: Diagnosis not present

## 2018-09-12 DIAGNOSIS — I69398 Other sequelae of cerebral infarction: Secondary | ICD-10-CM | POA: Diagnosis not present

## 2018-09-12 DIAGNOSIS — F259 Schizoaffective disorder, unspecified: Secondary | ICD-10-CM | POA: Diagnosis not present

## 2018-09-12 DIAGNOSIS — M6281 Muscle weakness (generalized): Secondary | ICD-10-CM | POA: Diagnosis not present

## 2018-09-14 DIAGNOSIS — Z0000 Encounter for general adult medical examination without abnormal findings: Secondary | ICD-10-CM | POA: Diagnosis not present

## 2018-09-14 DIAGNOSIS — E7800 Pure hypercholesterolemia, unspecified: Secondary | ICD-10-CM | POA: Diagnosis not present

## 2018-09-14 DIAGNOSIS — I639 Cerebral infarction, unspecified: Secondary | ICD-10-CM | POA: Diagnosis not present

## 2018-09-14 DIAGNOSIS — F250 Schizoaffective disorder, bipolar type: Secondary | ICD-10-CM | POA: Diagnosis not present

## 2018-09-14 DIAGNOSIS — F17200 Nicotine dependence, unspecified, uncomplicated: Secondary | ICD-10-CM | POA: Diagnosis not present

## 2018-09-14 DIAGNOSIS — J449 Chronic obstructive pulmonary disease, unspecified: Secondary | ICD-10-CM | POA: Diagnosis not present

## 2018-09-14 DIAGNOSIS — E039 Hypothyroidism, unspecified: Secondary | ICD-10-CM | POA: Diagnosis not present

## 2018-09-14 DIAGNOSIS — E46 Unspecified protein-calorie malnutrition: Secondary | ICD-10-CM | POA: Diagnosis not present

## 2018-09-14 DIAGNOSIS — E559 Vitamin D deficiency, unspecified: Secondary | ICD-10-CM | POA: Diagnosis not present

## 2018-09-14 DIAGNOSIS — E1121 Type 2 diabetes mellitus with diabetic nephropathy: Secondary | ICD-10-CM | POA: Diagnosis not present

## 2018-09-14 DIAGNOSIS — N182 Chronic kidney disease, stage 2 (mild): Secondary | ICD-10-CM | POA: Diagnosis not present

## 2018-09-14 DIAGNOSIS — Z79899 Other long term (current) drug therapy: Secondary | ICD-10-CM | POA: Diagnosis not present

## 2018-09-20 ENCOUNTER — Ambulatory Visit (INDEPENDENT_AMBULATORY_CARE_PROVIDER_SITE_OTHER): Payer: Medicare Other | Admitting: Adult Health

## 2018-09-20 ENCOUNTER — Other Ambulatory Visit: Payer: Self-pay

## 2018-09-20 ENCOUNTER — Encounter: Payer: Self-pay | Admitting: Adult Health

## 2018-09-20 VITALS — BP 127/83 | HR 76 | Temp 98.0°F | Ht 61.0 in | Wt 163.8 lb

## 2018-09-20 DIAGNOSIS — I10 Essential (primary) hypertension: Secondary | ICD-10-CM

## 2018-09-20 DIAGNOSIS — E782 Mixed hyperlipidemia: Secondary | ICD-10-CM

## 2018-09-20 DIAGNOSIS — M6281 Muscle weakness (generalized): Secondary | ICD-10-CM | POA: Diagnosis not present

## 2018-09-20 DIAGNOSIS — I6381 Other cerebral infarction due to occlusion or stenosis of small artery: Secondary | ICD-10-CM

## 2018-09-20 DIAGNOSIS — R2689 Other abnormalities of gait and mobility: Secondary | ICD-10-CM | POA: Diagnosis not present

## 2018-09-20 DIAGNOSIS — I639 Cerebral infarction, unspecified: Secondary | ICD-10-CM | POA: Diagnosis not present

## 2018-09-20 DIAGNOSIS — E039 Hypothyroidism, unspecified: Secondary | ICD-10-CM | POA: Diagnosis not present

## 2018-09-20 DIAGNOSIS — I69392 Facial weakness following cerebral infarction: Secondary | ICD-10-CM | POA: Diagnosis not present

## 2018-09-20 DIAGNOSIS — I69398 Other sequelae of cerebral infarction: Secondary | ICD-10-CM | POA: Diagnosis not present

## 2018-09-20 DIAGNOSIS — Z720 Tobacco use: Secondary | ICD-10-CM | POA: Diagnosis not present

## 2018-09-20 DIAGNOSIS — I69320 Aphasia following cerebral infarction: Secondary | ICD-10-CM | POA: Diagnosis not present

## 2018-09-20 DIAGNOSIS — R471 Dysarthria and anarthria: Secondary | ICD-10-CM

## 2018-09-20 NOTE — Progress Notes (Signed)
[facility] [address] [place]. [place] [id number] [phone]       HOSPITAL FOLLOW UP NOTE  [name] Day Date of Birth:  1950-10-17 Medical Record Number:  [id number]   Reason for Referral:  hospital stroke follow up    CHIEF COMPLAINT:  Chief Complaint  Patient presents with  . Hospitalization Follow-up    Alone. Rm 9. No concerns at this time.     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  History obtained from patient and chart review. Reviewed all radiology images and labs personally.  [name] Day is a 68 y.o. female with history of schizoaffective disorder, current tobacco use, HLD, hypothyroidism and possible DM presenting with dysarthria. She did not receive IV t-PA due to late presentation (>4.5 hours from time of onset).  Stroke work-up revealed acute lacunar infarct in the medial left lentiform and/or internal capsule as evidenced on MRI likely secondary to small vessel disease with residual mild to moderate dysarthria.  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  Vessel imaging with CTA head and neck showed mild new cranial artery stenosis without large vessel occlusion or significant stenosis.  Carotid Dopplers unremarkable.  2D echo unremarkable.  Recommended DAPT for 3 weeks and Plavix alone as previously on aspirin.  HTN stable.  LDL 90 and recommended initiating atorvastatin 20 mg daily.  A1c satisfactory.  Former tobacco use with cessation 2 months prior.  Questionable history of a flutter and recommended follow-up outpatient.  Other stroke risk factors include advanced age, history of substance abuse and history of stroke/TIA on imaging.  She states she has recovered well from a stroke standpoint without residual deficits She does continue to work with physical therapy for  balance difficulties which has been present prior to her stroke.  She is able to ambulate without assistive device and denies any recent falls She also endorses mild short-term memory loss which is also been present since her stroke and denies any worsening She lives in her own house but does have her granddaughter living with her.  She is able to maintain ADLs and IADLs independently Difficulty remembering medication she is currently on but believes she discontinued aspirin and has continued on Plavix alone as recommended -denies bleeding or bruising Unsure if she has continued on atorvastatin as recommended at discharge Blood pressure today satisfactory at 127/83 She has returned to smoking approximately half pack per Day.  She is unable to state why she returned to smoking but is not interested or willing in smoking cessation at this time Denies new or worsening stroke/TIA symptoms     ROS:   14 system review of systems performed and negative with exception of ear pain, runny nose, cough, FMC urination, back pain, leg pain, moles and memory loss  PMH:  Past Medical History:  Diagnosis Date  . Hyperlipidemia   . Hypothyroidism   . Schizoaffective disorder (HCC)   . Tobacco abuse     PSH: History reviewed. No pertinent surgical history.  Social History:  Social History   Socioeconomic History  . Marital status: Single    Spouse name: Not on file  . Number of children: Not on file  . Years of education: Not on file  . Highest education level: Not on file  Occupational History  . Not on file  Social Needs  . Financial  resource strain: Not very hard  . Food insecurity    Worry: Never true    Inability: Never true  . Transportation needs    Medical: No    Non-medical: No  Tobacco Use  . Smoking status: Never Smoker  . Smokeless tobacco: Never Used  Substance and Sexual Activity  . Alcohol use: Never    Frequency: Never  . Drug use: Not Currently  . Sexual activity:  Not Currently  Lifestyle  . Physical activity    Days per week: 2 days    Minutes per session: 20 min  . Stress: To some extent  Relationships  . Social Musicianconnections    Talks on phone: Twice a week    Gets together: Once a week    Attends religious service: More than 4 times per year    Active member of club or organization: Yes    Attends meetings of clubs or organizations: 1 to 4 times per year    Relationship status: Divorced  . Intimate partner violence    Fear of current or ex partner: No    Emotionally abused: No    Physically abused: No    Forced sexual activity: No  Other Topics Concern  . Not on file  Social History Narrative  . Not on file    Family History:  Family History  Problem Relation Age of Onset  . Asthma Other   . Hypertension Other   . Heart disease Other   . Diabetes Other     Medications:   Current Outpatient Medications on File Prior to Visit  Medication Sig Dispense Refill  . Calcium Carbonate-Vit D-Min (CALCIUM 1200 PO) Take 1 Day by mouth daily.    [name] Day Take 50 mcg by mouth daily before breakfast.    . atorvastatin (LIPITOR) 80 MG Day Take 1 Day (80 mg total) by mouth daily at 6 PM. (Patient not taking: Reported on 09/20/2018) 30 Day 1  . cholecalciferol (VITAMIN D) 25 MCG (1000 UT) Day Take 1,000 Units by mouth daily.    . citalopram (CELEXA) 20 MG Day Take 20 mg by mouth daily.    . clopidogrel (PLAVIX) 75 MG Day Take 1 Day (75 mg total) by mouth daily. 30 Day 3  . OLANZapine (ZYPREXA) 10 MG Day Take 10 mg by mouth daily.    . traZODone (DESYREL) 50 MG Day Take 50 mg by mouth at bedtime.     No current facility-administered medications on file prior to visit.     Allergies:  No Known Allergies   Physical Exam  Vitals:   09/20/18 1344  BP: 127/83  Pulse: 76  Temp: 98 F (36.7 C)  TempSrc: Oral  Weight: 163 lb 12.8 oz (74.3 kg)  Height: 5\' 1"  (1.549 m)   Body  mass index is 30.95 kg/m. No exam data present  Dep[facility] screen [facility] 09/20/2018  Decreased Interest 0  Down, Depressed, Hopeless 0  PHQ - 2 Score 0     General: well developed, well nourished,  pleasant middle-aged African-American female, seated, in no evident distress Head: head normocephalic and atraumatic.   Neck: supple with no carotid or supraclavicular bruits Cardiovascular: regular rate and rhythm, no murmurs Musculoskeletal: no deformity Skin:  no rash/petichiae Vascular:  Normal pulses all extremities   Neurologic Exam Mental Status: Awake and fully alert.   Mild dysarthria. Oriented to place and time.  Recent memory diminished especially with difficulty remembering medications.  Appears as  though long-term memory intact.  Attention span, concentration and fund of knowledge mostly appropriate.  Flat affect. Cranial Nerves: Fundoscopic exam reveals sharp disc margins. Pupils equal, briskly reactive to light. Extraocular movements full without nystagmus. Visual fields full to confrontation. Hearing intact. Facial sensation intact. Face, tongue, palate moves normally and symmetrically.  Motor: Normal bulk and tone. Normal strength in all tested extremity muscles. Sensory.: intact to touch , pinprick , position and vibratory sensation.  Coordination: Rapid alternating movements normal in all extremities. Finger-to-nose and heel-to-shin performed accurately bilaterally. Gait and Station: Arises from chair without difficulty. Stance is normal. Gait demonstrates normal stride length and balance.  Performs tandem gait without difficulty.  Romberg negative. Reflexes: 1+ and symmetric. Toes downgoing.     NIHSS  1 Modified Rankin  1    Diagnostic Data (Labs, Imaging, Testing)  Ct Angio Head W Or Wo Contrast Ct Angio Neck W Or Wo Contrast 08/05/2018 IMPRESSION:  1. Mild intracranial atherosclerosis without large vessel occlusion or significant stenosis.  2. Widely patent  cervical carotid and vertebral arteries.   Ct Head Wo Contrast 08/04/2018 IMPRESSION:  No acute intracranial pathology.  Mr Brain Wo Contrast (neuro Protocol) 08/04/2018 IMPRESSION:  1. Acute lacunar infarct in the medial left lentiform and/or internal capsule. No associated hemorrhage or mass effect.  2. Possible chronic lacune in the posterior right lentiform. Patchy cerebral white matter signal changes which might also reflect chronic small vessel disease.  3. Questionable exophthalmos, consider Thyroid Eye Disease.   [facility] [place] [facility]otid [facility]) 08/04/2018 Summary:  Right Carotid: Velocities in the right ICA are consistent with a 1-39% stenosis.  Left Carotid: Velocities in the left ICA are consistent with a 1-39% stenosis.   Transthoracic Echocardiogram  08/05/2018 IMPRESSIONS 1. The left ventricle has normal systolic function with an ejection fraction of 60-65%. The cavity size was normal. Left ventricular diastolic Doppler parameters are consistent with impaired relaxation. 2. The right ventricle has normal systolic function. The cavity was normal. There is no increase in right ventricular wall thickness. 3. Left atrial size was mild-moderately dilated. 4. No evidence of mitral valve stenosis. 5. The aortic valve is tricuspid. No stenosis of the aortic valve. 6. The aortic root is normal in size and structure. 7. Pulmonary hypertension is indeterminant, inadequate TR jet. 8. The interatrial septum was not well visualized.   EKG - SB rate 53 BPM. "Aflutter is no longer present compared to 2008"  (See cardiology reading for complete details)   Lipid Panel     Component Value Date/Time   CHOL 180 08/05/2018 0353   TRIG 44 08/05/2018 0353   HDL 81 08/05/2018 0353   CHOLHDL 2.2 08/05/2018 0353   VLDL 9 08/05/2018 0353   LDLCALC 90 08/05/2018 0353      ASSESSMENT: [name] Day is a 68 y.o. year old female here with acute lacunar infarct in the  medial left lentiform and/or internal capsule on 08/05/18 secondary to small vessel disease. Vascular risk factors include hx of tobacco use, HLD, HTN, and hx of stroke on imaging.  Subjectively recovered well from a stroke standpoint but observed residual mild dysarthria.  Balance and short-term memory loss have been stable and ongoing prior to stroke    PLAN:  1. Lacunar infarct : Advised to ensure she has continued on clopidogrel 75 mg daily  and lipitor  for secondary stroke prevention.  Advised to ensure she has discontinued aspirin as 3-week DAPT completed and no indication for prolonged  DAPT use.  Maintain strict control of hypertension with blood pressure goal below 130/90, diabetes with hemoglobin A1c goal below 6.5% and cholesterol with LDL cholesterol (bad cholesterol) goal below 70 mg/dL.  I also advised the patient to eat a healthy diet with plenty of whole grains, cereals, fruits and vegetables, exercise regularly with at least 30 minutes of continuous activity daily and maintain ideal body weight. 2. HTN: Advised to continue current treatment regimen.  Today's BP stable.  Advised to continue to monitor at home along with continued follow-up with PCP for management 3. HLD: Advised to continue current treatment regimen along with continued follow-up with PCP for future prescribing and monitoring of lipid panel 4. Tobacco use: Discussion regarding importance of smoking cessation with information provided.  Patient is not willing to quit at this time.  Advised her to seek assistance from PCP if needed when willing to quit   Follow up in 4 months or call earlier if needed   Greater than 50% of time during this 45 minute visit was spent on counseling, explanation of diagnosis of lacunar infarct, reviewing risk factor management of HTN and HLD, planning of further management along with potential future management, and discussion with patient and family answering all questions.    Venancio Poisson, AGNP-BC  [facility] [address] [place], [place] [address]  Phone [phone] Fax [phone] Note: This document was prepared with digital dictation and possible smart phrase technology. Any transcriptional errors that result from this process are unintentional.

## 2018-09-20 NOTE — Patient Instructions (Addendum)
Continue clopidogrel 75 mg daily  and lipitor (atorvastatin)  for secondary stroke prevention  Ensure you have discontinued use of aspirin and continuing on Plavix (clopidogrel) only  Continue to work with physical therapy for ongoing balance difficulties  Continue to follow up with PCP regarding cholesterol and blood pressure management   Highly encourage smoking cessation for secondary stroke prevention   For mild memory loss, you can do memory exercises such as crossword puzzles, word search, reading and card games  Continue to monitor blood pressure at home  Maintain strict control of hypertension with blood pressure goal below 130/90, diabetes with hemoglobin A1c goal below 6.5% and cholesterol with LDL cholesterol (bad cholesterol) goal below 70 mg/dL. I also advised the patient to eat a healthy diet with plenty of whole grains, cereals, fruits and vegetables, exercise regularly and maintain ideal body weight.  Followup in the future with me in 4 months or call earlier if needed       Thank you for coming to see [place] at [facility]. I hope we have been able to provide you high quality care today.  You may receive a patient satisfaction survey over the next few weeks. We would appreciate your feedback and comments so that we may continue to improve ourselves and the health of our patients.    Smoking Tobacco Information, Adult Smoking tobacco can be harmful to your health. Tobacco contains a poisonous (toxic), colorless chemical called nicotine. Nicotine is addictive. It changes the brain and can make it hard to stop smoking. Tobacco also has other toxic chemicals that can hurt your body and raise your risk of many cancers. How can smoking tobacco affect me? Smoking tobacco puts you at risk for:  Cancer. Smoking is most commonly associated with lung cancer, but can also lead to cancer in other parts of the body.  Chronic obstructive pulmonary disease (COPD).  This is a long-term lung condition that makes it hard to breathe. It also gets worse over time.  High blood pressure (hypertension), heart disease, stroke, or heart attack.  Lung infections, such as pneumonia.  Cataracts. This is when the lenses in the eyes become clouded.  Digestive problems. This may include peptic ulcers, heartburn, and gastroesophageal reflux disease (GERD).  Oral health problems, such as gum disease and tooth loss.  Loss of taste and smell. Smoking can affect your appearance by causing:  Wrinkles.  Yellow or stained teeth, fingers, and fingernails. Smoking tobacco can also affect your social life, because:  It may be challenging to find places to smoke when away from home. Many workplaces, [employer], hotels, and public places are tobacco-free.  Smoking is expensive. This is due to the cost of tobacco and the long-term costs of treating health problems from smoking.  Secondhand smoke may affect those around you. Secondhand smoke can cause lung cancer, breathing problems, and heart disease. Children of smokers have a higher risk for: ? Sudden infant death syndrome (SIDS). ? Ear infections. ? Lung infections. If you currently smoke tobacco, quitting now can help you:  Lead a longer and healthier life.  Look, smell, breathe, and feel better over time.  Save money.  Protect others from the harms of secondhand smoke. What actions can I take to prevent health problems? Quit smoking   Do not start smoking. Quit if you already do.  Make a plan to quit smoking and commit to it. Look for programs to help you and ask your health care provider for recommendations and ideas.  Set a date and write down all the reasons you want to quit.  Let your friends and family know you are quitting so they can help and support you. Consider finding friends who also want to quit. It can be easier to quit with someone else, so that you can support each other.  Talk with  your health care provider about using nicotine replacement medicines to help you quit, such as gum, lozenges, patches, sprays, or pills.  Do not replace cigarette smoking with electronic cigarettes, which are commonly called e-cigarettes. The safety of e-cigarettes is not known, and some may contain harmful chemicals.  If you try to quit but return to smoking, stay positive. It is common to slip up when you first quit, so take it one day at a time.  Be prepared for cravings. When you feel the urge to smoke, chew gum or suck on hard candy. Lifestyle  Stay busy and take care of your body.  Drink enough fluid to keep your urine pale yellow.  Get plenty of exercise and eat a healthy diet. This can help prevent weight gain after quitting.  Monitor your eating habits. Quitting smoking can cause you to have a larger appetite than when you smoke.  Find ways to relax. Go out with friends or family to a movie or a restaurant where people do not smoke.  Ask your health care provider about having regular tests (screenings) to check for cancer. This may include blood tests, imaging tests, and other tests.  Find ways to manage your stress, such as meditation, yoga, or exercise. Where to find support To get support to quit smoking, consider:  Asking your health care provider for more information and resources.  Taking classes to learn more about quitting smoking.  Looking for local organizations that offer resources about quitting smoking.  Joining a support group for people who want to quit smoking in your local community.  Calling the smokefree.gov counselor helpline: 1-800-Quit-Now 442 328 0392(1-951-403-7959) Where to find more information You may find more information about quitting smoking from:  HelpGuide.org: www.helpguide.org  BankRights.uySmokefree.gov: smokefree.gov  American Lung Association: www.lung.org Contact a health care provider if you:  Have problems breathing.  Notice that your lips, nose,  or fingers turn blue.  Have chest pain.  Are coughing up blood.  Feel faint or you pass out.  Have other health changes that cause you to worry. Summary  Smoking tobacco can negatively affect your health, the health of those around you, your finances, and your social life.  Do not start smoking. Quit if you already do. If you need help quitting, ask your health care provider.  Think about joining a support group for people who want to quit smoking in your local community. There are many effective programs that will help you to quit this behavior. This information is not intended to replace advice given to you by your health care provider. Make sure you discuss any questions you have with your health care provider. Document Released: 02/10/2016 Document Revised: 03/16/2017 Document Reviewed: 02/10/2016 Elsevier Patient Education  2020 [employer]

## 2018-09-21 DIAGNOSIS — E7800 Pure hypercholesterolemia, unspecified: Secondary | ICD-10-CM | POA: Diagnosis not present

## 2018-09-21 DIAGNOSIS — E039 Hypothyroidism, unspecified: Secondary | ICD-10-CM | POA: Diagnosis not present

## 2018-09-21 DIAGNOSIS — Z79899 Other long term (current) drug therapy: Secondary | ICD-10-CM | POA: Diagnosis not present

## 2018-09-21 DIAGNOSIS — E559 Vitamin D deficiency, unspecified: Secondary | ICD-10-CM | POA: Diagnosis not present

## 2018-09-21 DIAGNOSIS — E1121 Type 2 diabetes mellitus with diabetic nephropathy: Secondary | ICD-10-CM | POA: Diagnosis not present

## 2018-09-22 DIAGNOSIS — E039 Hypothyroidism, unspecified: Secondary | ICD-10-CM | POA: Diagnosis not present

## 2018-09-22 DIAGNOSIS — I69398 Other sequelae of cerebral infarction: Secondary | ICD-10-CM | POA: Diagnosis not present

## 2018-09-22 DIAGNOSIS — R2689 Other abnormalities of gait and mobility: Secondary | ICD-10-CM | POA: Diagnosis not present

## 2018-09-22 DIAGNOSIS — I69392 Facial weakness following cerebral infarction: Secondary | ICD-10-CM | POA: Diagnosis not present

## 2018-09-22 DIAGNOSIS — I69320 Aphasia following cerebral infarction: Secondary | ICD-10-CM | POA: Diagnosis not present

## 2018-09-22 DIAGNOSIS — M6281 Muscle weakness (generalized): Secondary | ICD-10-CM | POA: Diagnosis not present

## 2018-09-23 NOTE — Progress Notes (Signed)
I agree with the above plan 

## 2018-09-26 DIAGNOSIS — R2689 Other abnormalities of gait and mobility: Secondary | ICD-10-CM | POA: Diagnosis not present

## 2018-09-26 DIAGNOSIS — I69392 Facial weakness following cerebral infarction: Secondary | ICD-10-CM | POA: Diagnosis not present

## 2018-09-26 DIAGNOSIS — I69320 Aphasia following cerebral infarction: Secondary | ICD-10-CM | POA: Diagnosis not present

## 2018-09-26 DIAGNOSIS — I69398 Other sequelae of cerebral infarction: Secondary | ICD-10-CM | POA: Diagnosis not present

## 2018-09-26 DIAGNOSIS — M6281 Muscle weakness (generalized): Secondary | ICD-10-CM | POA: Diagnosis not present

## 2018-09-26 DIAGNOSIS — E039 Hypothyroidism, unspecified: Secondary | ICD-10-CM | POA: Diagnosis not present

## 2018-09-28 DIAGNOSIS — R2689 Other abnormalities of gait and mobility: Secondary | ICD-10-CM | POA: Diagnosis not present

## 2018-09-28 DIAGNOSIS — M6281 Muscle weakness (generalized): Secondary | ICD-10-CM | POA: Diagnosis not present

## 2018-09-28 DIAGNOSIS — I69392 Facial weakness following cerebral infarction: Secondary | ICD-10-CM | POA: Diagnosis not present

## 2018-09-28 DIAGNOSIS — I69320 Aphasia following cerebral infarction: Secondary | ICD-10-CM | POA: Diagnosis not present

## 2018-09-28 DIAGNOSIS — E039 Hypothyroidism, unspecified: Secondary | ICD-10-CM | POA: Diagnosis not present

## 2018-09-28 DIAGNOSIS — I69398 Other sequelae of cerebral infarction: Secondary | ICD-10-CM | POA: Diagnosis not present

## 2018-10-03 DIAGNOSIS — M6281 Muscle weakness (generalized): Secondary | ICD-10-CM | POA: Diagnosis not present

## 2018-10-03 DIAGNOSIS — E039 Hypothyroidism, unspecified: Secondary | ICD-10-CM | POA: Diagnosis not present

## 2018-10-03 DIAGNOSIS — I69398 Other sequelae of cerebral infarction: Secondary | ICD-10-CM | POA: Diagnosis not present

## 2018-10-03 DIAGNOSIS — R2689 Other abnormalities of gait and mobility: Secondary | ICD-10-CM | POA: Diagnosis not present

## 2018-10-03 DIAGNOSIS — I69392 Facial weakness following cerebral infarction: Secondary | ICD-10-CM | POA: Diagnosis not present

## 2018-10-03 DIAGNOSIS — I69320 Aphasia following cerebral infarction: Secondary | ICD-10-CM | POA: Diagnosis not present

## 2018-10-06 DIAGNOSIS — I69392 Facial weakness following cerebral infarction: Secondary | ICD-10-CM | POA: Diagnosis not present

## 2018-10-06 DIAGNOSIS — I69320 Aphasia following cerebral infarction: Secondary | ICD-10-CM | POA: Diagnosis not present

## 2018-10-06 DIAGNOSIS — I69398 Other sequelae of cerebral infarction: Secondary | ICD-10-CM | POA: Diagnosis not present

## 2018-10-06 DIAGNOSIS — M6281 Muscle weakness (generalized): Secondary | ICD-10-CM | POA: Diagnosis not present

## 2018-10-06 DIAGNOSIS — E039 Hypothyroidism, unspecified: Secondary | ICD-10-CM | POA: Diagnosis not present

## 2018-10-06 DIAGNOSIS — R2689 Other abnormalities of gait and mobility: Secondary | ICD-10-CM | POA: Diagnosis not present

## 2018-10-11 DIAGNOSIS — I69392 Facial weakness following cerebral infarction: Secondary | ICD-10-CM | POA: Diagnosis not present

## 2018-10-11 DIAGNOSIS — E039 Hypothyroidism, unspecified: Secondary | ICD-10-CM | POA: Diagnosis not present

## 2018-10-11 DIAGNOSIS — I69398 Other sequelae of cerebral infarction: Secondary | ICD-10-CM | POA: Diagnosis not present

## 2018-10-11 DIAGNOSIS — I69320 Aphasia following cerebral infarction: Secondary | ICD-10-CM | POA: Diagnosis not present

## 2018-10-11 DIAGNOSIS — M6281 Muscle weakness (generalized): Secondary | ICD-10-CM | POA: Diagnosis not present

## 2018-10-11 DIAGNOSIS — R2689 Other abnormalities of gait and mobility: Secondary | ICD-10-CM | POA: Diagnosis not present

## 2018-10-26 ENCOUNTER — Other Ambulatory Visit: Payer: Self-pay

## 2018-10-26 ENCOUNTER — Ambulatory Visit
Admission: RE | Admit: 2018-10-26 | Discharge: 2018-10-26 | Disposition: A | Discharge status: Home / Self Care | Payer: Medicare Other | Source: Ambulatory Visit | Attending: Family Medicine | Admitting: Family Medicine

## 2018-10-26 DIAGNOSIS — Z1231 Encounter for screening mammogram for malignant neoplasm of breast: Secondary | ICD-10-CM

## 2019-01-22 ENCOUNTER — Encounter: Payer: Self-pay | Admitting: Adult Health

## 2019-01-22 ENCOUNTER — Other Ambulatory Visit: Payer: Self-pay

## 2019-01-22 ENCOUNTER — Ambulatory Visit (INDEPENDENT_AMBULATORY_CARE_PROVIDER_SITE_OTHER): Payer: Medicare Other | Admitting: Adult Health

## 2019-01-22 VITALS — BP 130/78 | HR 64 | Temp 97.3°F | Ht 61.0 in | Wt 168.6 lb

## 2019-01-22 DIAGNOSIS — E782 Mixed hyperlipidemia: Secondary | ICD-10-CM

## 2019-01-22 DIAGNOSIS — R7309 Other abnormal glucose: Secondary | ICD-10-CM | POA: Diagnosis not present

## 2019-01-22 DIAGNOSIS — Z720 Tobacco use: Secondary | ICD-10-CM | POA: Diagnosis not present

## 2019-01-22 DIAGNOSIS — I6381 Other cerebral infarction due to occlusion or stenosis of small artery: Secondary | ICD-10-CM

## 2019-01-22 DIAGNOSIS — I10 Essential (primary) hypertension: Secondary | ICD-10-CM | POA: Diagnosis not present

## 2019-01-22 NOTE — Progress Notes (Signed)
[facility] [address] [place]. [place] [address] [phone]       OFFICE FOLLOW UP NOTE  [name] Date of Birth:  [birth year] Medical Record Number:  [id number]   Reason for Referral:  stroke follow up    CHIEF COMPLAINT:  Chief Complaint  Patient presents with  . Follow-up    4 mon f/u. Alone. Treatment room. No new concerns at this time.     HPI: Stroke admission 08/05/2018: [name] is a 68 y.o. female with history of schizoaffective disorder, current tobacco use, HLD, hypothyroidism and possible DM presenting with dysarthria. She did not receive IV t-PA due to late presentation (>4.5 hours from time of onset).  Stroke work-up revealed acute lacunar infarct in the medial left lentiform and/or internal capsule as evidenced on MRI likely secondary to small vessel disease with residual mild to moderate dysarthria.  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  Vessel imaging with CTA head and neck showed mild new cranial artery stenosis without large vessel occlusion or significant stenosis.  Carotid Dopplers unremarkable.  2D echo unremarkable.  Recommended DAPT for 3 weeks and Plavix alone as previously on aspirin.  HTN stable.  LDL 90 and recommended initiating atorvastatin 20 mg daily.  A1c satisfactory.  Former tobacco use with cessation 2 months prior.  Questionable history of a flutter and recommended follow-up outpatient.  Other stroke risk factors include advanced age, history of substance abuse and history of stroke/TIA on imaging.  Initial visit 09/20/2018: She states she has recovered well from a stroke standpoint without residual deficits She does continue to work with physical therapy for balance difficulties which has been present prior to her stroke.  She is able to ambulate without assistive device and denies any recent falls She also endorses mild short-term memory loss which is also been present since her  stroke and denies any worsening She lives in her own house but does have her granddaughter living with her.  She is able to maintain ADLs and IADLs independently Difficulty remembering medication she is currently on but believes she discontinued aspirin and has continued on Plavix alone as recommended -denies bleeding or bruising Unsure if she has continued on atorvastatin as recommended at discharge Blood pressure today satisfactory at 127/83 She has returned to smoking approximately half pack per day.  She is unable to state why she returned to smoking but is not interested or willing in smoking cessation at this time Denies new or worsening stroke/TIA symptoms  Update 01/22/2019: [name] Day is a 68 year old female who is being seen today for stroke follow-up.  She has been doing well from a stroke standpoint since prior visit without any reoccurring or new stroke/TIA symptoms.  No residual deficits from prior stroke.  She has continued on Plavix and atorvastatin for secondary stroke prevention without side effects.  She has not had recent blood work obtained by PCP and does not have follow-up appointment until August.  Blood pressure today 130/78.  She continues to smoke tobacco approximately 1 pack/day.  She denies increased stress that caused the increase of tobacco use.  No further concerns at this time.       ROS:   14 system review of systems performed and negative with exception of no complaints  PMH:  Past Medical History:  Diagnosis Date  . Hyperlipidemia   . Hypothyroidism   . Schizoaffective disorder (HCC)   . Tobacco abuse     PSH:  History reviewed. No pertinent surgical history.  Social History:  Social History   Socioeconomic History  . Marital status: Single    Spouse name: Not on file  . Number of children: Not on file  . Years of education: Not on file  . Highest education level: Not on file  Occupational History  . Not on file  Tobacco Use  . Smoking  status: Never Smoker  . Smokeless tobacco: Never Used  Substance and Sexual Activity  . Alcohol use: Never  . Drug use: Not Currently  . Sexual activity: Not Currently  Other Topics Concern  . Not on file  Social History Narrative  . Not on file   Social Determinants of Health   Financial Resource Strain: Low Risk   . Difficulty of Paying Living Expenses: Not very hard  Food Insecurity: No Food Insecurity  . Worried About Programme researcher, broadcasting/film/video in the Last Year: Never true  . Ran Out of Food in the Last Year: Never true  Transportation Needs: No Transportation Needs  . Lack of Transportation (Medical): No  . Lack of Transportation (Non-Medical): No  Physical Activity: Insufficiently Active  . Days of Exercise per Week: 2 days  . Minutes of Exercise per Session: 20 min  Stress: Stress Concern Present  . Feeling of Stress : To some extent  Social Connections: Slightly Isolated  . Frequency of Communication with Friends and Family: Twice a week  . Frequency of Social Gatherings with Friends and Family: Once a week  . Attends Religious Services: More than 4 times per year  . Active Member of Clubs or Organizations: Yes  . Attends Banker Meetings: 1 to 4 times per year  . Marital Status: Divorced  Catering manager Violence: Not At Risk  . Fear of Current or Ex-Partner: No  . Emotionally Abused: No  . Physically Abused: No  . Sexually Abused: No    Family History:  Family History  Problem Relation Age of Onset  . Asthma Other   . Hypertension Other   . Heart disease Other   . Diabetes Other     Medications:   Current Outpatient Medications on File Prior to Visit  Medication Sig Dispense Refill  . atorvastatin (LIPITOR) 80 MG tablet Take 1 tablet (80 mg total) by mouth daily at 6 PM. 30 tablet 1  . Calcium Carbonate-Vit D-Min (CALCIUM 1200 PO) Take 1 tablet by mouth daily.    . cholecalciferol (VITAMIN D) 25 MCG (1000 UT) tablet Take 1,000 Units by mouth  daily.    . citalopram (CELEXA) 20 MG tablet Take 20 mg by mouth daily.    . clopidogrel (PLAVIX) 75 MG tablet Take 1 tablet (75 mg total) by mouth daily. 30 tablet 3  . levothyroxine (SYNTHROID) 50 MCG tablet Take 50 mcg by mouth daily before breakfast.    . OLANZapine (ZYPREXA) 10 MG tablet Take 10 mg by mouth daily.    . traZODone (DESYREL) 50 MG tablet Take 50 mg by mouth at bedtime.     No current facility-administered medications on file prior to visit.    Allergies:  No Known Allergies   Physical Exam  Vitals:   01/22/19 1253  BP: 130/78  Pulse: 64  Temp: (!) 97.3 F (36.3 C)  TempSrc: Oral  Weight: 168 lb 9.6 oz (76.5 kg)  Height: 5\' 1"  (1.549 m)   Body mass index is 31.86 kg/m. No exam data present  General: well developed, well nourished,  pleasant  middle-aged African-American female, seated, in no evident distress Head: head normocephalic and atraumatic.   Neck: supple with no carotid or supraclavicular bruits Cardiovascular: regular rate and rhythm, no murmurs Musculoskeletal: no deformity Skin:  no rash/petichiae Vascular:  Normal pulses all extremities   Neurologic Exam Mental Status: Awake and fully alert.   Normal speech and language.  Oriented to place and time.  Recent memory diminished especially with difficulty remembering medications.  Appears as though long-term memory intact.  Attention span, concentration and fund of knowledge mostly appropriate.  Flat affect. Cranial Nerves: Pupils equal, briskly reactive to light. Extraocular movements full without nystagmus. Visual fields full to confrontation. Hearing intact. Facial sensation intact. Face, tongue, palate moves normally and symmetrically.  Motor: Normal bulk and tone. Normal strength in all tested extremity muscles. Sensory.: intact to touch , pinprick , position and vibratory sensation.  Coordination: Rapid alternating movements normal in all extremities. Finger-to-nose and heel-to-shin performed  accurately bilaterally. Gait and Station: Arises from chair without difficulty. Stance is normal. Gait demonstrates normal stride length and balance.  Performs tandem gait without difficulty.   Reflexes: 1+ and symmetric. Toes downgoing.       Diagnostic Data (Labs, Imaging, Testing)  Ct Angio Head W Or Wo Contrast Ct Angio Neck W Or Wo Contrast 08/05/2018 IMPRESSION:  1. Mild intracranial atherosclerosis without large vessel occlusion or significant stenosis.  2. Widely patent cervical carotid and vertebral arteries.   Ct Head Wo Contrast 08/04/2018 IMPRESSION:  No acute intracranial pathology.  Mr Brain Wo Contrast (neuro Protocol) 08/04/2018 IMPRESSION:  1. Acute lacunar infarct in the medial left lentiform and/or internal capsule. No associated hemorrhage or mass effect.  2. Possible chronic lacune in the posterior right lentiform. Patchy cerebral white matter signal changes which might also reflect chronic small vessel disease.  3. Questionable exophthalmos, consider Thyroid Eye Disease.   Vas [place] Carotid (at [facility]) 08/04/2018 Summary:  Right Carotid: Velocities in the right ICA are consistent with a 1-39% stenosis.  Left Carotid: Velocities in the left ICA are consistent with a 1-39% stenosis.   Transthoracic Echocardiogram  08/05/2018 IMPRESSIONS 1. The left ventricle has normal systolic function with an ejection fraction of 60-65%. The cavity size was normal. Left ventricular diastolic Doppler parameters are consistent with impaired relaxation. 2. The right ventricle has normal systolic function. The cavity was normal. There is no increase in right ventricular wall thickness. 3. Left atrial size was mild-moderately dilated. 4. No evidence of mitral valve stenosis. 5. The aortic valve is tricuspid. No stenosis of the aortic valve. 6. The aortic root is normal in size and structure. 7. Pulmonary hypertension is indeterminant, inadequate TR jet. 8. The  interatrial septum was not well visualized.   EKG - SB rate 53 BPM. "Aflutter is no longer present compared to 2008"  (See cardiology reading for complete details)   Lipid Panel     Component Value Date/Time   CHOL 180 08/05/2018 0353   TRIG 44 08/05/2018 0353   HDL 81 08/05/2018 0353   CHOLHDL 2.2 08/05/2018 0353   VLDL 9 08/05/2018 0353   LDLCALC 90 08/05/2018 0353      ASSESSMENT: [name] Day is a 68 y.o. year old female here with acute lacunar infarct in the medial left lentiform and/or internal capsule on 08/05/18 secondary to small vessel disease. Vascular risk factors include hx of tobacco use, HLD, HTN, and hx of stroke on imaging.  Recovered well from a stroke standpoint without residual deficits.  PLAN:  1. Lacunar infarct : Continued on clopidogrel 75 mg daily  and lipitor  for secondary stroke prevention. Maintain strict control of hypertension with blood pressure goal below 130/90, diabetes with hemoglobin A1c goal below 6.5% and cholesterol with LDL cholesterol (bad cholesterol) goal below 70 mg/dL.  I also advised the patient to eat a healthy diet with plenty of whole grains, cereals, fruits and vegetables, exercise regularly with at least 30 minutes of continuous activity daily and maintain ideal body weight. 2. HTN: Advised to continue current treatment regimen.  Today's BP stable.  Advised to continue to monitor at home along with continued follow-up with PCP for management.  3. HLD: Advised to continue current treatment regimen along with continued follow-up with PCP for future prescribing and monitoring of lipid panel.  Will obtain lipid panel at today's visit to ensure satisfactory management but did advise ongoing monitoring and atorvastatin prescribing will be done by PCP 4. Elevated A1c: Elevated A1c at hospitalization 6.2.  Will recheck A1c at today's visit 5. Tobacco use: Increase of tobacco use from prior visit and again discussed increased risk of  reoccurring stroke and other worsening medical conditions with ongoing use of tobacco.  Unable to fully determine if patient is willing to quit at this time but she does verbalize understanding   Follow-up as needed or call earlier if needed as overall stable from stroke standpoint   Greater than 50% of time during this 25 minute visit was spent on counseling, explanation of diagnosis of lacunar infarct, reviewing risk factor management of HTN and HLD, discussion regarding indication for rechecking A1c with prior elevation showing prediabetes, discussion regarding the importance of tobacco cessation, planning of further management along with potential future management, and discussion with patient answering all questions to [place], AGNP-BC  [facility] [address] [place], [place] [address]  Phone [phone] Fax [phone] Note: This document was prepared with digital dictation and possible smart phrase technology. Any transcriptional errors that result from this process are unintentional.

## 2019-01-22 NOTE — Patient Instructions (Addendum)
Continue clopidogrel 75 mg daily  and Lipitor for secondary stroke prevention  Continue to follow up with PCP regarding cholesterol and blood pressure management   We will check your cholesterol levels and A1c level as these have not been completed since you had your stroke.  You will be called with the results tomorrow morning with any abnormal levels otherwise we will continue current regimen and follow-up with your primary care provider at your next scheduled visit  Highly encourage smoking cessation for stroke prevention -you can discuss ways to help you quit such as with medication management or therapy with your primary care provider  Continue to monitor blood pressure at home  Maintain strict control of hypertension with blood pressure goal below 130/90, diabetes with hemoglobin A1c goal below 6.5% and cholesterol with LDL cholesterol (bad cholesterol) goal below 70 mg/dL. I also advised the patient to eat a healthy diet with plenty of whole grains, cereals, fruits and vegetables, exercise regularly and maintain ideal body weight.        Thank you for coming to see [place] at [facility]. I hope we have been able to provide you high quality care today.  You may receive a patient satisfaction survey over the next few weeks. We would appreciate your feedback and comments so that we may continue to improve ourselves and the health of our patients.

## 2019-01-23 ENCOUNTER — Telehealth: Payer: Self-pay | Admitting: *Deleted

## 2019-01-23 LAB — LIPID PANEL
Chol/HDL Ratio: 2.5 ratio (ref 0.0–4.4)
Cholesterol, Total: 160 mg/dL (ref 100–199)
HDL: 64 mg/dL (ref >39)
LDL Chol Calc (NIH): 82 mg/dL (ref 0–99)
Triglycerides: 72 mg/dL (ref 0–149)
VLDL Cholesterol Cal: 14 mg/dL (ref 5–40)

## 2019-01-23 LAB — HEMOGLOBIN A1C
Est. average glucose Bld gHb Est-mCnc: 137 mg/dL
Hgb A1c MFr Bld: 6.4 % — ABNORMAL HIGH (ref 4.8–5.6)

## 2019-01-23 NOTE — Telephone Encounter (Signed)
I spoke to pt and relayed that JM/NP stated  her recent cholesterol levels overall satisfactory and continue current regimen. A1c slightly elevated at 6.4 and will need follow-up with PCP.  She verbalized understanding. Will fax labs to Dr. Stephanie Acre.

## 2019-01-23 NOTE — Telephone Encounter (Signed)
-----   Message from Frann Rider, NP sent at 01/23/2019  8:13 AM EST ----- Please advise above recommendations

## 2019-04-10 ENCOUNTER — Ambulatory Visit: Payer: Medicare Other | Attending: Internal Medicine

## 2019-04-10 DIAGNOSIS — Z23 Encounter for immunization: Secondary | ICD-10-CM | POA: Insufficient documentation

## 2019-04-10 NOTE — Progress Notes (Signed)
   Covid-19 Vaccination Clinic  Name:  [name] Day    MRN: [id number] DOB: 1951-01-17  04/10/2019  [name] was observed post Covid-19 immunization for 15 minutes without incident. She was provided with Vaccine Information Sheet and instruction to access the V-Safe system.   [name] was instructed to call 911 with any severe reactions post vaccine: Marland Kitchen Difficulty breathing  . Swelling of face and throat  . A fast heartbeat  . A bad rash all over body  . Dizziness and weakness   Immunizations Administered    Name Date Dose VIS Date Route   Moderna COVID-19 Vaccine 04/10/2019  1:09 PM 0.5 mL 01/09/2019 Intramuscular   Manufacturer: Moderna   Lot: [id number]   NDC: 97915-041-36

## 2019-05-08 ENCOUNTER — Ambulatory Visit: Payer: Medicare Other | Attending: Internal Medicine

## 2019-05-08 DIAGNOSIS — Z23 Encounter for immunization: Secondary | ICD-10-CM

## 2019-05-08 NOTE — Progress Notes (Signed)
   Covid-19 Vaccination Clinic  Name:  [name] Day    MRN: [id number] DOB: November 07, 1950  05/08/2019  [name] Day was observed post Covid-19 immunization for 15 minutes without incident. She was provided with Vaccine Information Sheet and instruction to access the V-Safe system.   [name] Day was instructed to call 911 with any severe reactions post vaccine: Marland Kitchen Difficulty breathing  . Swelling of face and throat  . A fast heartbeat  . A bad rash all over body  . Dizziness and weakness   Immunizations Administered    Name Date Dose VIS Date Route   Moderna COVID-19 Vaccine 05/08/2019  1:04 PM 0.5 mL 01/09/2019 Intramuscular   Manufacturer: Moderna   Lot: [id number]   NDC: 47185-501-58

## 2019-08-17 ENCOUNTER — Ambulatory Visit (INDEPENDENT_AMBULATORY_CARE_PROVIDER_SITE_OTHER): Payer: Medicare Other | Admitting: Podiatry

## 2019-08-17 ENCOUNTER — Encounter: Payer: Self-pay | Admitting: Podiatry

## 2019-08-17 ENCOUNTER — Other Ambulatory Visit: Payer: Self-pay

## 2019-08-17 DIAGNOSIS — M79675 Pain in left toe(s): Secondary | ICD-10-CM | POA: Diagnosis not present

## 2019-08-17 DIAGNOSIS — B351 Tinea unguium: Secondary | ICD-10-CM

## 2019-08-17 DIAGNOSIS — E119 Type 2 diabetes mellitus without complications: Secondary | ICD-10-CM | POA: Diagnosis not present

## 2019-08-17 DIAGNOSIS — M79674 Pain in right toe(s): Secondary | ICD-10-CM | POA: Diagnosis not present

## 2019-08-17 DIAGNOSIS — Q828 Other specified congenital malformations of skin: Secondary | ICD-10-CM

## 2019-08-17 NOTE — Patient Instructions (Signed)
Diabetes Mellitus and Foot Care Foot care is an important part of your health, especially when you have diabetes. Diabetes may cause you to have problems because of poor blood flow (circulation) to your feet and legs, which can cause your skin to:  Become thinner and drier.  Break more easily.  Heal more slowly.  Peel and crack. You may also have nerve damage (neuropathy) in your legs and feet, causing decreased feeling in them. This means that you may not notice minor injuries to your feet that could lead to more serious problems. Noticing and addressing any potential problems early is the best way to prevent future foot problems. How to care for your feet Foot hygiene  Wash your feet daily with warm water and mild soap. Do not use hot water. Then, pat your feet and the areas between your toes until they are completely dry. Do not soak your feet as this can dry your skin.  Trim your toenails straight across. Do not dig under them or around the cuticle. File the edges of your nails with an emery board or nail file.  Apply a moisturizing lotion or petroleum jelly to the skin on your feet and to dry, brittle toenails. Use lotion that does not contain alcohol and is unscented. Do not apply lotion between your toes. Shoes and socks  Wear clean socks or stockings every day. Make sure they are not too tight. Do not wear knee-high stockings since they may decrease blood flow to your legs.  Wear shoes that fit properly and have enough cushioning. Always look in your shoes before you put them on to be sure there are no objects inside.  To break in new shoes, wear them for just a few hours a day. This prevents injuries on your feet. Wounds, scrapes, corns, and calluses  Check your feet daily for blisters, cuts, bruises, sores, and redness. If you cannot see the bottom of your feet, use a mirror or ask someone for help.  Do not cut corns or calluses or try to remove them with medicine.  If you  find a minor scrape, cut, or break in the skin on your feet, keep it and the skin around it clean and dry. You may clean these areas with mild soap and water. Do not clean the area with peroxide, alcohol, or iodine.  If you have a wound, scrape, corn, or callus on your foot, look at it several times a day to make sure it is healing and not infected. Check for: ? Redness, swelling, or pain. ? Fluid or blood. ? Warmth. ? Pus or a bad smell. General instructions  Do not cross your legs. This may decrease blood flow to your feet.  Do not use heating pads or hot water bottles on your feet. They may burn your skin. If you have lost feeling in your feet or legs, you may not know this is happening until it is too late.  Protect your feet from hot and cold by wearing shoes, such as at the beach or on hot pavement.  Schedule a complete foot exam at least once a year (annually) or more often if you have foot problems. If you have foot problems, report any cuts, sores, or bruises to your health care provider immediately. Contact a health care provider if:  You have a medical condition that increases your risk of infection and you have any cuts, sores, or bruises on your feet.  You have an injury that is not   healing.  You have redness on your legs or feet.  You feel burning or tingling in your legs or feet.  You have pain or cramps in your legs and feet.  Your legs or feet are numb.  Your feet always feel cold.  You have pain around a toenail. Get help right away if:  You have a wound, scrape, corn, or callus on your foot and: ? You have pain, swelling, or redness that gets worse. ? You have fluid or blood coming from the wound, scrape, corn, or callus. ? Your wound, scrape, corn, or callus feels warm to the touch. ? You have pus or a bad smell coming from the wound, scrape, corn, or callus. ? You have a fever. ? You have a red line going up your leg. Summary  Check your feet every day  for cuts, sores, red spots, swelling, and blisters.  Moisturize feet and legs daily.  Wear shoes that fit properly and have enough cushioning.  If you have foot problems, report any cuts, sores, or bruises to your health care provider immediately.  Schedule a complete foot exam at least once a year (annually) or more often if you have foot problems. This information is not intended to replace advice given to you by your health care provider. Make sure you discuss any questions you have with your health care provider. Document Revised: 10/18/2018 Document Reviewed: 02/27/2016 Elsevier Patient Education  2020 [employer]  

## 2019-08-21 ENCOUNTER — Encounter: Payer: Self-pay | Admitting: Podiatry

## 2019-08-21 NOTE — Progress Notes (Signed)
  Subjective:  Patient ID: [name] Day, female    DOB: 12/14/1950,  MRN: [id number]  Chief Complaint  Patient presents with  . Diabetes    A1C  6.14, diabetic foot exam  . Callouses    painful callus lesion  . Nail Problem    thick painful toenails   69 y.o. female returns for the above complaint.  Patient presents with complaint of thickened elongated dystrophic toenails x10.  Patient stated a painful to walk on.  Patient is a diabetic with A1c of 6.14.  Patient states that she has these painful lesions as well as that on the bottom of bilateral submet 5.  They have also been very painful when ambulating on.  She would like to have them on debrided down including the nails.  She is not able to do it herself.  She denies any other acute complaints.  Objective:  There were no vitals filed for this visit. Podiatric Exam: Vascular: dorsalis pedis and posterior tibial pulses are palpable bilateral. Capillary return is immediate. Temperature gradient is WNL. Skin turgor WNL  Sensorium: Normal Semmes Weinstein monofilament test. Normal tactile sensation bilaterally. Nail Exam: Pt has thick disfigured discolored nails with subungual debris noted bilateral entire nail hallux through fifth toenails.  Pain on palpation to the nails. Ulcer Exam: There is no evidence of ulcer or pre-ulcerative changes or infection. Orthopedic Exam: Muscle tone and strength are WNL. No limitations in general ROM. No crepitus or effusions noted. HAV  B/L.  Hammer toes 2-5  B/L. Skin: Hyperkeratotic lesion noted to bilateral submetatarsal 5.  There is a central nucleated core noted.  No pinpoint bleeding noted.. No infection or ulcers    Assessment & Plan:   1. Porokeratosis   2. Diabetes mellitus type 2, diet-controlled (HCC)   3. Pain due to onychomycosis of toenails of both feet     Patient was evaluated and treated and all questions answered.  Bilateral submetatarsal 5 porokeratosis/benign skin  lesion -I explained to the patient the etiology of porokeratosis and various treatment options were extensively discussed.  Given that she has a lot of pain associated with this I believe patient will benefit from aggressive debridement.  Using chisel blade and a handle, the hyperkeratotic lesion was debrided down to healthy striated tissue with excision of the central nucleated core.  Upon removal patient had good relief.  No complication noted.  No pinpoint bleeding noted.  Onychomycosis with pain  -Nails palliatively debrided as below. -Educated on self-care  Procedure: Nail Debridement Rationale: pain  Type of Debridement: manual, sharp debridement. Instrumentation: Nail nipper, rotary burr. Number of Nails: 10  Procedures and Treatment: Consent by patient was obtained for treatment procedures. The patient understood the discussion of treatment and procedures well. All questions were answered thoroughly reviewed. Debridement of mycotic and hypertrophic toenails, 1 through 5 bilateral and clearing of subungual debris. No ulceration, no infection noted.  Return Visit-Office Procedure: Patient instructed to return to the office for a follow up visit 3 months for continued evaluation and treatment.  Nicholes Rough, DPM    Return in about 3 months (around 11/17/2019) for diabetic nail and callus trim.

## 2019-09-07 ENCOUNTER — Other Ambulatory Visit: Payer: Medicare Other | Admitting: Orthotics

## 2019-09-07 ENCOUNTER — Other Ambulatory Visit: Payer: Self-pay

## 2019-09-24 ENCOUNTER — Other Ambulatory Visit: Payer: Self-pay | Admitting: Family Medicine

## 2019-09-24 DIAGNOSIS — Z1231 Encounter for screening mammogram for malignant neoplasm of breast: Secondary | ICD-10-CM

## 2019-09-26 ENCOUNTER — Ambulatory Visit: Payer: Medicare Other | Admitting: Podiatry

## 2019-10-09 ENCOUNTER — Other Ambulatory Visit: Payer: Self-pay | Admitting: Family Medicine

## 2019-10-09 DIAGNOSIS — F17210 Nicotine dependence, cigarettes, uncomplicated: Secondary | ICD-10-CM

## 2019-10-29 ENCOUNTER — Ambulatory Visit
Admission: RE | Admit: 2019-10-29 | Discharge: 2019-10-29 | Disposition: A | Discharge status: Home / Self Care | Payer: Medicare Other | Source: Ambulatory Visit | Attending: Family Medicine | Admitting: Family Medicine

## 2019-10-29 ENCOUNTER — Ambulatory Visit: Payer: Medicare Other

## 2019-10-29 ENCOUNTER — Other Ambulatory Visit: Payer: Self-pay

## 2019-10-29 DIAGNOSIS — Z1231 Encounter for screening mammogram for malignant neoplasm of breast: Secondary | ICD-10-CM

## 2019-11-13 ENCOUNTER — Ambulatory Visit: Payer: Medicare Other

## 2019-11-16 ENCOUNTER — Other Ambulatory Visit: Payer: Self-pay

## 2019-11-16 ENCOUNTER — Encounter: Payer: Self-pay | Admitting: Podiatry

## 2019-11-16 ENCOUNTER — Ambulatory Visit (INDEPENDENT_AMBULATORY_CARE_PROVIDER_SITE_OTHER): Payer: Medicare Other | Admitting: Podiatry

## 2019-11-16 DIAGNOSIS — M79675 Pain in left toe(s): Secondary | ICD-10-CM

## 2019-11-16 DIAGNOSIS — Q828 Other specified congenital malformations of skin: Secondary | ICD-10-CM

## 2019-11-16 DIAGNOSIS — M2042 Other hammer toe(s) (acquired), left foot: Secondary | ICD-10-CM | POA: Diagnosis not present

## 2019-11-16 DIAGNOSIS — M79674 Pain in right toe(s): Secondary | ICD-10-CM

## 2019-11-16 DIAGNOSIS — M2041 Other hammer toe(s) (acquired), right foot: Secondary | ICD-10-CM | POA: Diagnosis not present

## 2019-11-16 DIAGNOSIS — E119 Type 2 diabetes mellitus without complications: Secondary | ICD-10-CM

## 2019-11-16 DIAGNOSIS — B351 Tinea unguium: Secondary | ICD-10-CM

## 2019-11-16 NOTE — Progress Notes (Signed)
  Subjective:  Patient ID: [name] Day, female    DOB: 26-Jan-1951,  MRN: [id number]  Chief Complaint  Patient presents with  . routine foot care    nail and callous trim   69 y.o. female returns for the above complaint.  Patient presents with complaint of thickened elongated dystrophic toenails x10.  Patient stated a painful to walk on.  Patient is a diabetic with A1c of 6.14.  Patient states that she has these painful lesions as well as that on the bottom of bilateral submet 5.  They have also been very painful when ambulating on.  She would like to have them on debrided down including the nails.  She is not able to do it herself.  She denies any other acute complaints.  Objective:  There were no vitals filed for this visit. Podiatric Exam: Vascular: dorsalis pedis and posterior tibial pulses are palpable bilateral. Capillary return is immediate. Temperature gradient is WNL. Skin turgor WNL  Sensorium: Normal Semmes Weinstein monofilament test. Normal tactile sensation bilaterally. Nail Exam: Pt has thick disfigured discolored nails with subungual debris noted bilateral entire nail hallux through fifth toenails.  Pain on palpation to the nails. Ulcer Exam: There is no evidence of ulcer or pre-ulcerative changes or infection. Orthopedic Exam: Muscle tone and strength are WNL. No limitations in general ROM. No crepitus or effusions noted. HAV  B/L.  Hammer toes 2-5  B/L. Skin: Hyperkeratotic lesion noted to bilateral submetatarsal 5.  There is a central nucleated core noted.  No pinpoint bleeding noted.. No infection or ulcers    Assessment & Plan:   1. Diabetes mellitus type 2, diet-controlled (HCC)   2. Pain due to onychomycosis of toenails of both feet   3. Porokeratosis   4. Hammertoe, bilateral     Patient was evaluated and treated and all questions answered.  Hammertoe contracture bilateral 2 through 5 -I explained patient the etiology of hammertoe contracture and various  treatment options were discussed.  Given the patient is a diabetic with mild neuropathy she will benefit from diabetic shoes and insoles to provide adequate even distribution of pressure and therefore preventing future ulceration.  Patient states understanding -She was scheduled see rick for custom-made diabetic shoes  Bilateral submetatarsal 5 porokeratosis/benign skin lesion -I explained to the patient the etiology of porokeratosis and various treatment options were extensively discussed.  Given that she has a lot of pain associated with this I believe patient will benefit from aggressive debridement.  Using chisel blade and a handle, the hyperkeratotic lesion was debrided down to healthy striated tissue with excision of the central nucleated core.  Upon removal patient had good relief.  No complication noted.  No pinpoint bleeding noted.  Onychomycosis with pain  -Nails palliatively debrided as below. -Educated on self-care  Procedure: Nail Debridement Rationale: pain  Type of Debridement: manual, sharp debridement. Instrumentation: Nail nipper, rotary burr. Number of Nails: 10  Procedures and Treatment: Consent by patient was obtained for treatment procedures. The patient understood the discussion of treatment and procedures well. All questions were answered thoroughly reviewed. Debridement of mycotic and hypertrophic toenails, 1 through 5 bilateral and clearing of subungual debris. No ulceration, no infection noted.  Return Visit-Office Procedure: Patient instructed to return to the office for a follow up visit 3 months for continued evaluation and treatment.  Nicholes Rough, DPM    No follow-ups on file.

## 2019-12-03 ENCOUNTER — Other Ambulatory Visit: Payer: Medicare Other | Admitting: Orthotics

## 2020-01-08 ENCOUNTER — Ambulatory Visit
Admission: RE | Admit: 2020-01-08 | Discharge: 2020-01-08 | Disposition: A | Discharge status: Home / Self Care | Payer: Medicare Other | Source: Ambulatory Visit | Attending: Family Medicine | Admitting: Family Medicine

## 2020-01-08 DIAGNOSIS — F17210 Nicotine dependence, cigarettes, uncomplicated: Secondary | ICD-10-CM

## 2020-02-14 ENCOUNTER — Ambulatory Visit: Payer: Medicare Other

## 2020-02-14 IMAGING — MG DIGITAL SCREENING BILATERAL MAMMOGRAM WITH TOMO AND CAD
8 series · 8 of 24 positions shown · non-contrast
Comparison: Previous exam(s).

CLINICAL DATA: Screening.

EXAM:
DIGITAL SCREENING BILATERAL MAMMOGRAM WITH TOMO AND CAD

[R CC synth-2D]
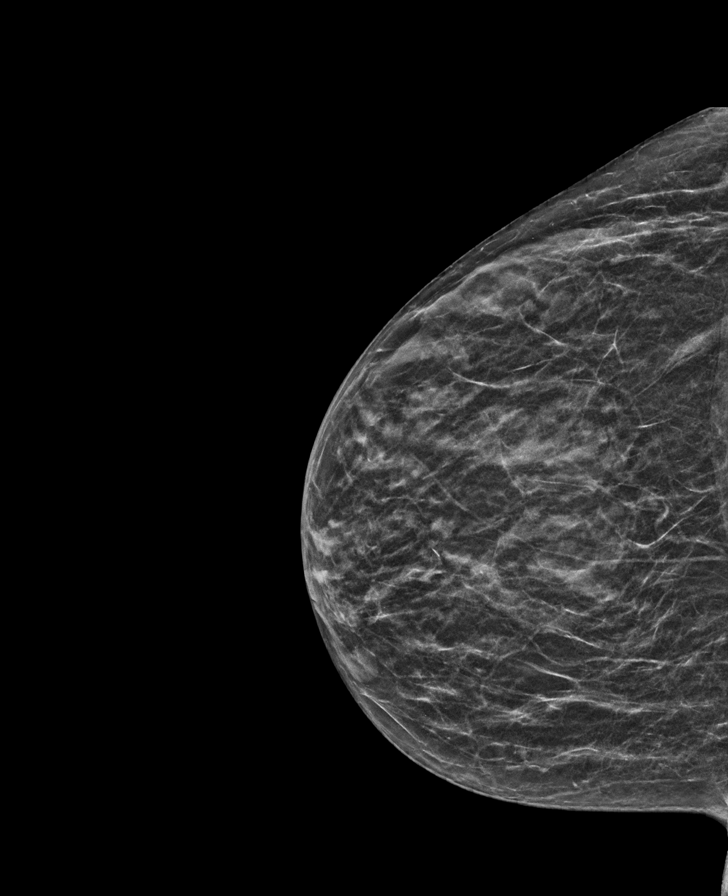

[R MLO synth-2D]
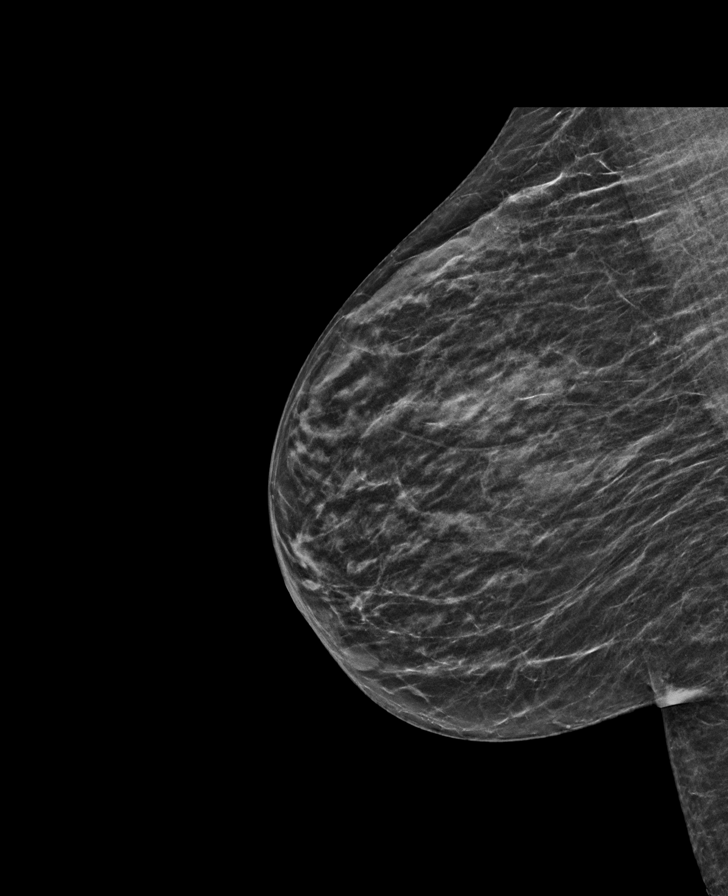

[L CC synth-2D]
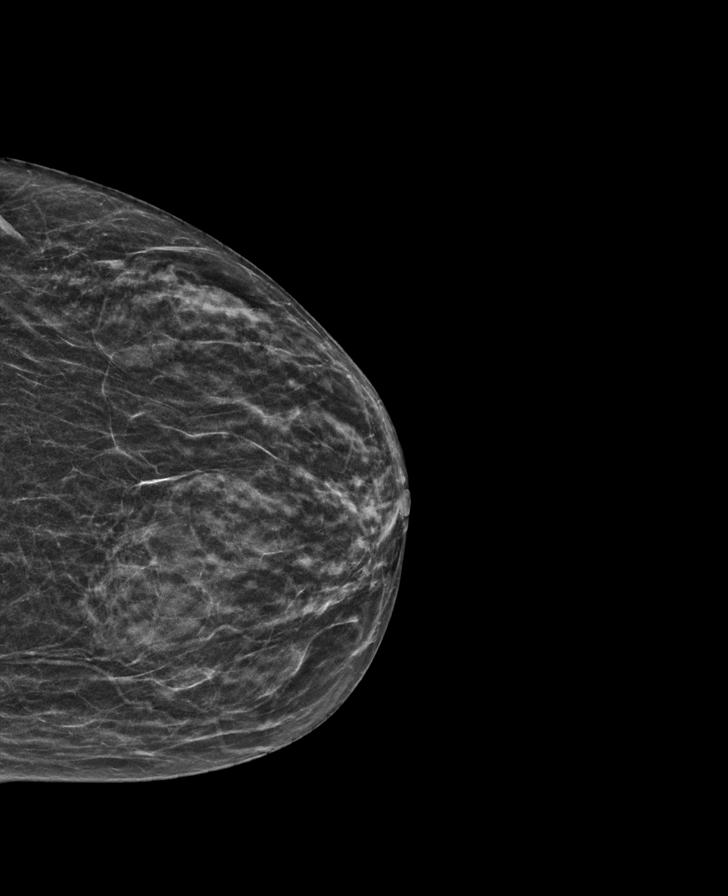

[L MLO synth-2D]
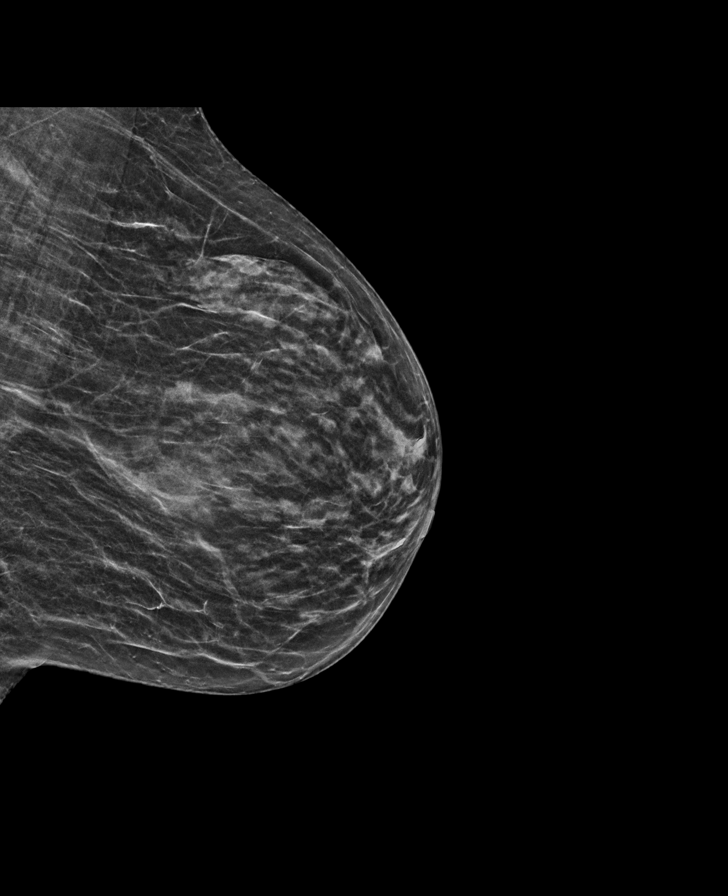

[L MLO tomo · tomo slice 22/43.0]
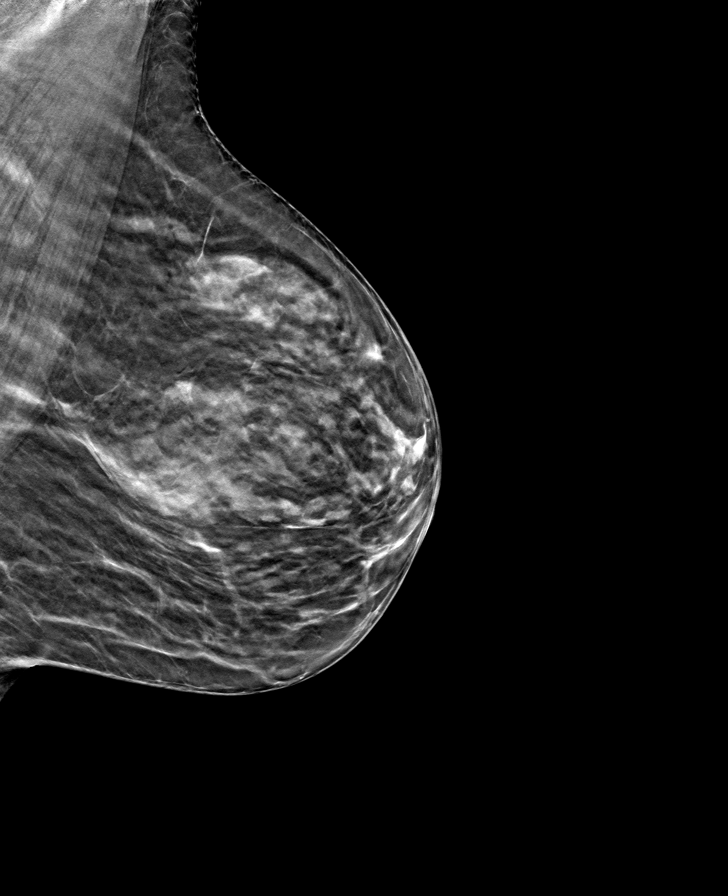

[R CC tomo · tomo slice 21/41.0]
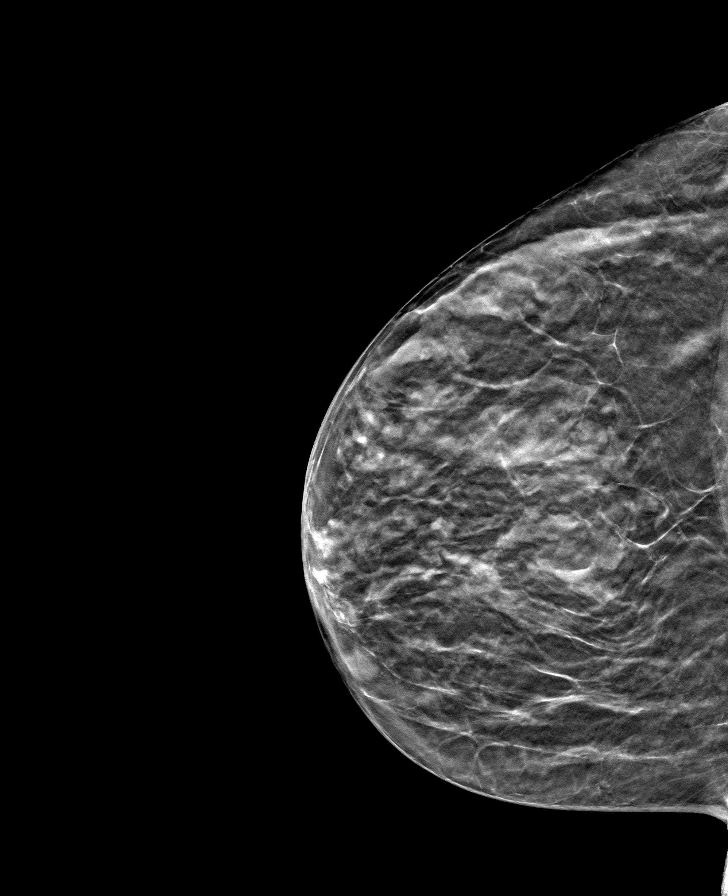

[L CC tomo · tomo slice 21/42.0]
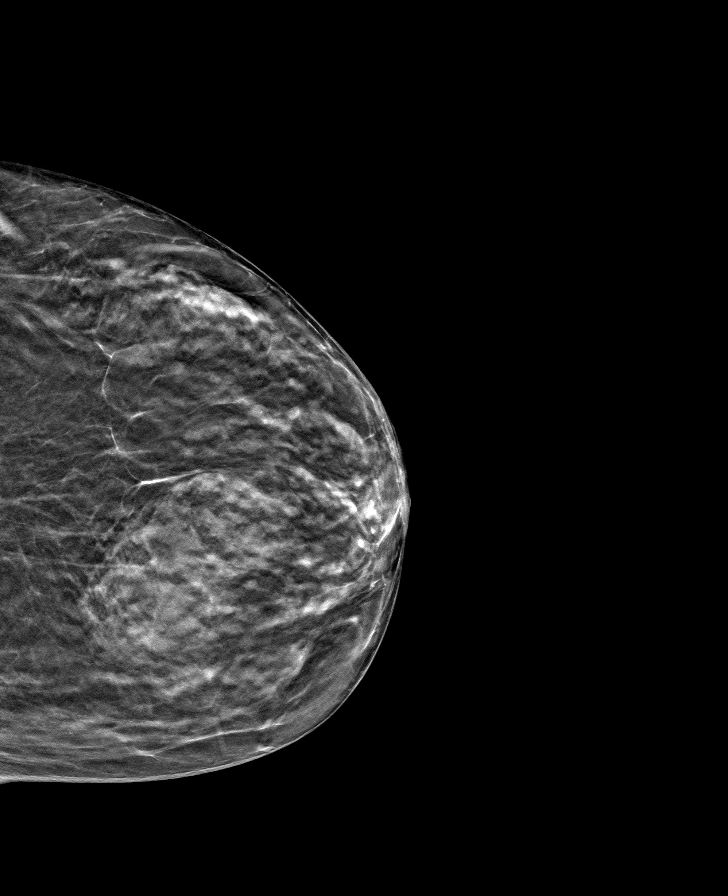

[R MLO tomo · tomo slice 21/41.0]
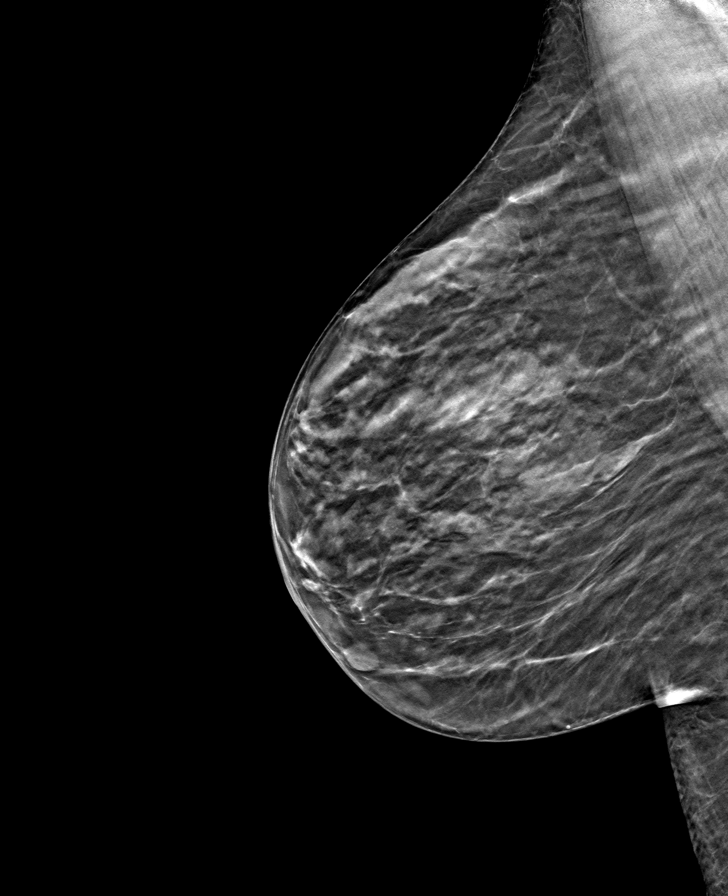

[8 of 24 positions shown; findings below may reference images not displayed]

ACR Breast Density Category b: There are scattered areas of
fibroglandular density.
FINDINGS: There are no findings suspicious for malignancy. Images were
processed with CAD.
IMPRESSION: No mammographic evidence of malignancy. A result letter of this
screening mammogram will be mailed directly to the patient.

RECOMMENDATION:
Screening mammogram in one year. (Code:[id number])

BI-RADS CATEGORY  1: Negative.

## 2020-02-20 ENCOUNTER — Ambulatory Visit (INDEPENDENT_AMBULATORY_CARE_PROVIDER_SITE_OTHER): Payer: Medicare Other | Admitting: Podiatry

## 2020-02-20 ENCOUNTER — Encounter: Payer: Self-pay | Admitting: Podiatry

## 2020-02-20 ENCOUNTER — Other Ambulatory Visit: Payer: Self-pay

## 2020-02-20 DIAGNOSIS — M79674 Pain in right toe(s): Secondary | ICD-10-CM

## 2020-02-20 DIAGNOSIS — B351 Tinea unguium: Secondary | ICD-10-CM

## 2020-02-20 DIAGNOSIS — M79675 Pain in left toe(s): Secondary | ICD-10-CM

## 2020-02-20 DIAGNOSIS — L853 Xerosis cutis: Secondary | ICD-10-CM | POA: Diagnosis not present

## 2020-02-20 DIAGNOSIS — E119 Type 2 diabetes mellitus without complications: Secondary | ICD-10-CM

## 2020-02-20 NOTE — Progress Notes (Signed)
Subjective:  Patient ID: [name] Day, female    DOB: Aug 10, 1950,  MRN: [id number]  Chief Complaint  Patient presents with  . routine foot care    Nail trim    70 y.o. female returns for the above complaint.  Patient presents with complaint of thickened elongated dystrophic toenails x10.  Patient stated a painful to walk on.  Patient would like for me to debride them.  She also has secondary complaint of dry skin to both heels.  She wants to know if there is any moisturization that she could utilize.  At this time she does not use most moisturization.  Objective:  There were no vitals filed for this visit. Podiatric Exam: Vascular: dorsalis pedis and posterior tibial pulses are palpable bilateral. Capillary return is immediate. Temperature gradient is WNL. Skin turgor WNL  Sensorium: Normal Semmes Weinstein monofilament test. Normal tactile sensation bilaterally. Nail Exam: Pt has thick disfigured discolored nails with subungual debris noted bilateral entire nail hallux through fifth toenails.  Pain on palpation to the nails. Ulcer Exam: There is no evidence of ulcer or pre-ulcerative changes or infection. Orthopedic Exam: Muscle tone and strength are WNL. No limitations in general ROM. No crepitus or effusions noted. HAV  B/L.  Hammer toes 2-5  B/L. Skin: Hyperkeratotic lesion noted to bilateral submetatarsal 5.  There is a central nucleated core noted.  No pinpoint bleeding noted.. No infection or ulcers xerosis noted to bilateral heel without any itching    Assessment & Plan:   1. Xerosis of skin   2. Diabetes mellitus type 2, diet-controlled (HCC)   3. Pain due to onychomycosis of toenails of both feet     Patient was evaluated and treated and all questions answered.  Bilateral xerosis to the heel -I explained to the patient the etiology of xerosis and various treatment options were extensively discussed.  I explained to the patient the importance of maintaining  moisturization of the skin with application of over-the-counter lotion such as Eucerin or Luciderm.  I have asked the patient to apply this twice a day.  If unable to resolve patient will benefit from prescription lotion.   Hammertoe contracture bilateral 2 through 5 -I explained patient the etiology of hammertoe contracture and various treatment options were discussed.  Given the patient is a diabetic with mild neuropathy she will benefit from diabetic shoes and insoles to provide adequate even distribution of pressure and therefore preventing future ulceration.  Patient states understanding -She was scheduled see rick for custom-made diabetic shoes  Bilateral submetatarsal 5 porokeratosis/benign skin lesion -I explained to the patient the etiology of porokeratosis and various treatment options were extensively discussed.  Given that she has a lot of pain associated with this I believe patient will benefit from aggressive debridement.  Using chisel blade and a handle, the hyperkeratotic lesion was debrided down to healthy striated tissue with excision of the central nucleated core.  Upon removal patient had good relief.  No complication noted.  No pinpoint bleeding noted.  Onychomycosis with pain  -Nails palliatively debrided as below. -Educated on self-care  Procedure: Nail Debridement Rationale: pain  Type of Debridement: manual, sharp debridement. Instrumentation: Nail nipper, rotary burr. Number of Nails: 10  Procedures and Treatment: Consent by patient was obtained for treatment procedures. The patient understood the discussion of treatment and procedures well. All questions were answered thoroughly reviewed. Debridement of mycotic and hypertrophic toenails, 1 through 5 bilateral and clearing of subungual debris. No ulceration, no infection noted.  Return Visit-Office Procedure: Patient instructed to return to the office for a follow up visit 3 months for continued evaluation and  treatment.  Nicholes Rough, DPM    No follow-ups on file.

## 2020-05-21 ENCOUNTER — Ambulatory Visit (INDEPENDENT_AMBULATORY_CARE_PROVIDER_SITE_OTHER): Payer: Medicare Other | Admitting: Podiatry

## 2020-05-21 ENCOUNTER — Encounter: Payer: Self-pay | Admitting: Podiatry

## 2020-05-21 ENCOUNTER — Other Ambulatory Visit: Payer: Self-pay

## 2020-05-21 DIAGNOSIS — B351 Tinea unguium: Secondary | ICD-10-CM | POA: Diagnosis not present

## 2020-05-21 DIAGNOSIS — E119 Type 2 diabetes mellitus without complications: Secondary | ICD-10-CM | POA: Diagnosis not present

## 2020-05-21 DIAGNOSIS — Q828 Other specified congenital malformations of skin: Secondary | ICD-10-CM | POA: Diagnosis not present

## 2020-05-21 DIAGNOSIS — M79675 Pain in left toe(s): Secondary | ICD-10-CM | POA: Diagnosis not present

## 2020-05-21 DIAGNOSIS — M79674 Pain in right toe(s): Secondary | ICD-10-CM | POA: Diagnosis not present

## 2020-05-21 NOTE — Progress Notes (Signed)
Subjective:  Patient ID: [name] Day, female    DOB: 12/27/1950,  MRN: [id number]  Chief Complaint  Patient presents with  . Callouses    Bilateral callouses    70 y.o. female returns for the above complaint.  Patient presents with complaint of thickened elongated dystrophic toenails x10.  Patient stated a painful to walk on.  Patient would like for me to debride them.  She also has complaint of porokeratosis bilateral submetatarsal 5 that is painful to walk on.  She would like to know if I can be debrided down.  She does have relief after debridement.  She denies any other acute complaints.  She is a diabetic.  Objective:  There were no vitals filed for this visit. Podiatric Exam: Vascular: dorsalis pedis and posterior tibial pulses are palpable bilateral. Capillary return is immediate. Temperature gradient is WNL. Skin turgor WNL  Sensorium: Normal Semmes Weinstein monofilament test. Normal tactile sensation bilaterally. Nail Exam: Pt has thick disfigured discolored nails with subungual debris noted bilateral entire nail hallux through fifth toenails.  Pain on palpation to the nails. Ulcer Exam: There is no evidence of ulcer or pre-ulcerative changes or infection. Orthopedic Exam: Muscle tone and strength are WNL. No limitations in general ROM. No crepitus or effusions noted. HAV  B/L.  Hammer toes 2-5  B/L. Skin: Hyperkeratotic lesion noted to bilateral submetatarsal 5.  There is a central nucleated core noted.  No pinpoint bleeding noted.. No infection or ulcers xerosis noted to bilateral heel without any itching    Assessment & Plan:   1. Diabetes mellitus type 2, diet-controlled (HCC)   2. Pain due to onychomycosis of toenails of both feet   3. Porokeratosis     Patient was evaluated and treated and all questions answered.  Bilateral xerosis to the heel -I explained to the patient the etiology of xerosis and various treatment options were extensively discussed.  I  explained to the patient the importance of maintaining moisturization of the skin with application of over-the-counter lotion such as Eucerin or Luciderm.  I have asked the patient to apply this twice a day.  If unable to resolve patient will benefit from prescription lotion.   Hammertoe contracture bilateral 2 through 5 -I explained patient the etiology of hammertoe contracture and various treatment options were discussed.  Given the patient is a diabetic with mild neuropathy she will benefit from diabetic shoes and insoles to provide adequate even distribution of pressure and therefore preventing future ulceration.  Patient states understanding -She was scheduled see rick for custom-made diabetic shoes  Bilateral submetatarsal 5 porokeratosis/benign skin lesion -I explained to the patient the etiology of porokeratosis and various treatment options were extensively discussed.  Given that she has a lot of pain associated with this I believe patient will benefit from aggressive debridement.  Using chisel blade and a handle, the hyperkeratotic lesion was debrided down to healthy striated tissue with excision of the central nucleated core.  Upon removal patient had good relief.  No complication noted.  No pinpoint bleeding noted.  Onychomycosis with pain  -Nails palliatively debrided as below. -Educated on self-care  Procedure: Nail Debridement Rationale: pain  Type of Debridement: manual, sharp debridement. Instrumentation: Nail nipper, rotary burr. Number of Nails: 10  Procedures and Treatment: Consent by patient was obtained for treatment procedures. The patient understood the discussion of treatment and procedures well. All questions were answered thoroughly reviewed. Debridement of mycotic and hypertrophic toenails, 1 through 5 bilateral and clearing of subungual  debris. No ulceration, no infection noted.  Return Visit-Office Procedure: Patient instructed to return to the office for a follow  up visit 3 months for continued evaluation and treatment.  Nicholes Rough, DPM    No follow-ups on file.

## 2020-09-01 ENCOUNTER — Encounter: Payer: Self-pay | Admitting: Podiatry

## 2020-09-01 ENCOUNTER — Ambulatory Visit (INDEPENDENT_AMBULATORY_CARE_PROVIDER_SITE_OTHER): Payer: Medicare Other | Admitting: Podiatry

## 2020-09-01 ENCOUNTER — Other Ambulatory Visit: Payer: Self-pay

## 2020-09-01 DIAGNOSIS — M79675 Pain in left toe(s): Secondary | ICD-10-CM | POA: Diagnosis not present

## 2020-09-01 DIAGNOSIS — E119 Type 2 diabetes mellitus without complications: Secondary | ICD-10-CM

## 2020-09-01 DIAGNOSIS — B351 Tinea unguium: Secondary | ICD-10-CM | POA: Diagnosis not present

## 2020-09-01 DIAGNOSIS — Q828 Other specified congenital malformations of skin: Secondary | ICD-10-CM | POA: Diagnosis not present

## 2020-09-01 DIAGNOSIS — M79674 Pain in right toe(s): Secondary | ICD-10-CM | POA: Diagnosis not present

## 2020-09-04 ENCOUNTER — Encounter: Payer: Self-pay | Admitting: Podiatry

## 2020-09-04 NOTE — Progress Notes (Signed)
  Subjective:  Patient ID: [name] Day, female    DOB: 11/19/1950,  MRN: [id number]  [name] Day presents to clinic today for at risk foot care with h/o diet controlled diabetes. She states she is waiting on new lab work. Chart review mentions h/o diabetes. She is seen for painful porokeratotic lesion(s) b/l feet and painful mycotic toenails that limit ambulation. Painful toenails interfere with ambulation. Aggravating factors include wearing enclosed shoe gear. Pain is relieved with periodic professional debridement. Painful porokeratotic lesions are aggravated when weightbearing with and without shoegear. Pain is relieved with periodic professional debridement.  PCP is Mila Palmer, MD , and last visit was 10/2019 per patient recollection.  No Known Allergies  Review of Systems: Negative except as noted in the HPI. Objective:   Constitutional [name] Day is a pleasant 70 y.o. African American female, in NAD. AAO x 3.   Vascular Capillary fill time to digits <3 seconds b/l lower extremities. Palpable pedal pulses b/l LE. Pedal hair sparse. Lower extremity skin temperature gradient within normal limits. No pain with calf compression b/l. No cyanosis or clubbing noted.  Neurologic Normal speech. Oriented to person, place, and time. Protective sensation intact 5/5 intact bilaterally with 10g monofilament b/l. Vibratory sensation intact b/l.  Dermatologic Pedal skin with normal turgor, texture and tone b/l lower extremities. No open wounds b/l lower extremities. No interdigital macerations b/l lower extremities. Toenails 1-5 b/l elongated, discolored, dystrophic, thickened, crumbly with subungual debris and tenderness to dorsal palpation. Porokeratotic lesion(s) submet head 5 left foot and submet head 5 right foot. No erythema, no edema, no drainage, no fluctuance.  Orthopedic: Normal muscle strength 5/5 to all lower extremity muscle groups bilaterally. No pain crepitus or joint  limitation noted with ROM b/l. Hallux valgus with bunion deformity noted b/l lower extremities. Hammertoe(s) noted to the 2-5 bilaterally.   Radiographs: None Assessment:   1. Pain due to onychomycosis of toenails of both feet   2. Porokeratosis   3. Diabetes mellitus type 2, diet-controlled (HCC)    Plan:  -Examined patient. -Continue diabetic foot care principles. -Patient to continue soft, supportive shoe gear daily. -Toenails 1-5 b/l were debrided in length and girth with sterile nail nippers and dremel without iatrogenic bleeding.  -Painful porokeratotic lesion(s) submet head 5 left foot and submet head 5 right foot pared and enucleated with sterile scalpel blade without incident. Total number of lesions debrided=2. -Patient to report any pedal injuries to medical professional immediately. -Patient/POA to call should there be question/concern in the interim.  Return in about 3 months (around 12/02/2020).  [name], DPM

## 2020-09-23 ENCOUNTER — Other Ambulatory Visit: Payer: Self-pay | Admitting: Family Medicine

## 2020-09-23 DIAGNOSIS — Z1231 Encounter for screening mammogram for malignant neoplasm of breast: Secondary | ICD-10-CM

## 2020-10-31 ENCOUNTER — Other Ambulatory Visit: Payer: Self-pay

## 2020-10-31 ENCOUNTER — Ambulatory Visit
Admission: RE | Admit: 2020-10-31 | Discharge: 2020-10-31 | Disposition: A | Discharge status: Home / Self Care | Payer: Medicare Other | Source: Ambulatory Visit | Attending: Family Medicine | Admitting: Family Medicine

## 2020-10-31 DIAGNOSIS — Z1231 Encounter for screening mammogram for malignant neoplasm of breast: Secondary | ICD-10-CM

## 2020-12-08 ENCOUNTER — Ambulatory Visit: Payer: Medicare Other | Admitting: Podiatry

## 2020-12-10 ENCOUNTER — Other Ambulatory Visit: Payer: Self-pay | Admitting: Family Medicine

## 2020-12-10 DIAGNOSIS — F17210 Nicotine dependence, cigarettes, uncomplicated: Secondary | ICD-10-CM

## 2020-12-29 ENCOUNTER — Inpatient Hospital Stay: Admission: RE | Admit: 2020-12-29 | Payer: Medicare Other | Source: Ambulatory Visit

## 2021-01-13 ENCOUNTER — Other Ambulatory Visit: Payer: Self-pay

## 2021-01-13 ENCOUNTER — Ambulatory Visit (INDEPENDENT_AMBULATORY_CARE_PROVIDER_SITE_OTHER): Payer: Medicare Other | Admitting: Podiatry

## 2021-01-13 DIAGNOSIS — M79675 Pain in left toe(s): Secondary | ICD-10-CM | POA: Diagnosis not present

## 2021-01-13 DIAGNOSIS — Q828 Other specified congenital malformations of skin: Secondary | ICD-10-CM | POA: Diagnosis not present

## 2021-01-13 DIAGNOSIS — B351 Tinea unguium: Secondary | ICD-10-CM

## 2021-01-13 DIAGNOSIS — L84 Corns and callosities: Secondary | ICD-10-CM | POA: Diagnosis not present

## 2021-01-13 DIAGNOSIS — E119 Type 2 diabetes mellitus without complications: Secondary | ICD-10-CM

## 2021-01-13 DIAGNOSIS — M79674 Pain in right toe(s): Secondary | ICD-10-CM | POA: Diagnosis not present

## 2021-01-13 DIAGNOSIS — M2011 Hallux valgus (acquired), right foot: Secondary | ICD-10-CM

## 2021-01-13 DIAGNOSIS — M2012 Hallux valgus (acquired), left foot: Secondary | ICD-10-CM

## 2021-01-15 ENCOUNTER — Ambulatory Visit
Admission: RE | Admit: 2021-01-15 | Discharge: 2021-01-15 | Disposition: A | Discharge status: Home / Self Care | Payer: Medicare Other | Source: Ambulatory Visit | Attending: Family Medicine | Admitting: Family Medicine

## 2021-01-15 DIAGNOSIS — F17210 Nicotine dependence, cigarettes, uncomplicated: Secondary | ICD-10-CM

## 2021-01-18 ENCOUNTER — Encounter: Payer: Self-pay | Admitting: Podiatry

## 2021-01-18 DIAGNOSIS — M25571 Pain in right ankle and joints of right foot: Secondary | ICD-10-CM | POA: Insufficient documentation

## 2021-01-18 DIAGNOSIS — E113293 Type 2 diabetes mellitus with mild nonproliferative diabetic retinopathy without macular edema, bilateral: Secondary | ICD-10-CM | POA: Insufficient documentation

## 2021-01-18 DIAGNOSIS — E2839 Other primary ovarian failure: Secondary | ICD-10-CM | POA: Insufficient documentation

## 2021-01-18 DIAGNOSIS — H6090 Unspecified otitis externa, unspecified ear: Secondary | ICD-10-CM | POA: Insufficient documentation

## 2021-01-18 DIAGNOSIS — F17210 Nicotine dependence, cigarettes, uncomplicated: Secondary | ICD-10-CM | POA: Insufficient documentation

## 2021-01-18 DIAGNOSIS — Z0000 Encounter for general adult medical examination without abnormal findings: Secondary | ICD-10-CM | POA: Insufficient documentation

## 2021-01-18 DIAGNOSIS — J449 Chronic obstructive pulmonary disease, unspecified: Secondary | ICD-10-CM | POA: Insufficient documentation

## 2021-01-18 DIAGNOSIS — I700 Atherosclerosis of aorta: Secondary | ICD-10-CM | POA: Insufficient documentation

## 2021-01-18 DIAGNOSIS — H35039 Hypertensive retinopathy, unspecified eye: Secondary | ICD-10-CM | POA: Insufficient documentation

## 2021-01-18 DIAGNOSIS — Z8673 Personal history of transient ischemic attack (TIA), and cerebral infarction without residual deficits: Secondary | ICD-10-CM | POA: Insufficient documentation

## 2021-01-18 DIAGNOSIS — M810 Age-related osteoporosis without current pathological fracture: Secondary | ICD-10-CM | POA: Insufficient documentation

## 2021-01-18 DIAGNOSIS — Z6827 Body mass index (BMI) 27.0-27.9, adult: Secondary | ICD-10-CM | POA: Insufficient documentation

## 2021-01-18 DIAGNOSIS — N182 Chronic kidney disease, stage 2 (mild): Secondary | ICD-10-CM | POA: Insufficient documentation

## 2021-01-18 DIAGNOSIS — I10 Essential (primary) hypertension: Secondary | ICD-10-CM | POA: Insufficient documentation

## 2021-01-18 DIAGNOSIS — E7800 Pure hypercholesterolemia, unspecified: Secondary | ICD-10-CM | POA: Insufficient documentation

## 2021-01-18 DIAGNOSIS — D692 Other nonthrombocytopenic purpura: Secondary | ICD-10-CM | POA: Insufficient documentation

## 2021-01-18 DIAGNOSIS — E46 Unspecified protein-calorie malnutrition: Secondary | ICD-10-CM | POA: Insufficient documentation

## 2021-01-18 DIAGNOSIS — I2510 Atherosclerotic heart disease of native coronary artery without angina pectoris: Secondary | ICD-10-CM | POA: Insufficient documentation

## 2021-01-18 DIAGNOSIS — E1121 Type 2 diabetes mellitus with diabetic nephropathy: Secondary | ICD-10-CM | POA: Insufficient documentation

## 2021-01-18 NOTE — Progress Notes (Signed)
ANNUAL DIABETIC FOOT EXAM  Subjective: [name] Day presents today for for annual diabetic foot examination and painful porokeratotic lesion(s) bilateral feet and painful mycotic toenails that limit ambulation. Painful toenails interfere with ambulation. Aggravating factors include wearing enclosed shoe gear. Pain is relieved with periodic professional debridement. Painful porokeratotic lesions are aggravated when weightbearing with and without shoegear. Pain is relieved with periodic professional debridement.  Patient relates 4 year h/o diabetes.  Patient denies h/o foot wounds.  Patient has symptoms of numbness, tingling, burning, or pins/needle sensation in feet.  Patient does not monitor blood glucose daily.  [name] Palmer, MD is patient's PCP. Last visit was 01/05/2021.  Past Medical History:  Diagnosis Date   Hyperlipidemia    Hypothyroidism    Schizoaffective disorder (HCC)    Tobacco abuse    Patient Active Problem List   Diagnosis Date Noted   Body mass index (BMI) 27.0-27.9, adult 01/18/2021   Chronic kidney disease, stage 2 (mild) 01/18/2021   Chronic obstructive pulmonary disease, unspecified (HCC) 01/18/2021   Coronary artery disease 01/18/2021   Decreased estrogen level 01/18/2021   Diabetic renal disease (HCC) 01/18/2021   Encounter for general adult medical examination without abnormal findings 01/18/2021   Essential hypertension 01/18/2021   Hardening of the aorta (main artery of the heart) (HCC) 01/18/2021   Hypertensive retinopathy 01/18/2021   Osteoporosis 01/18/2021   Otitis externa 01/18/2021   Pain in right ankle and joints of right foot 01/18/2021   Personal history of transient ischemic attack (TIA), and cerebral infarction without residual deficits 01/18/2021   Protein-calorie malnutrition (HCC) 01/18/2021   Pure hypercholesterolemia 01/18/2021   Senile purpura (HCC) 01/18/2021   Cigarette nicotine dependence without complication 01/18/2021    Type 2 diabetes mellitus with mild nonproliferative diabetic retinopathy without macular edema, bilateral (HCC) 01/18/2021   Cerebral infarction (HCC)    CVA (cerebral vascular accident) (HCC) 08/04/2018   Tobacco abuse    Hypothyroidism    Schizoaffective disorder (HCC)    Hyperlipidemia    Abnormal auditory perception of right ear 05/31/2016   Manson Passey hairy tongue 05/31/2016   History reviewed. No pertinent surgical history. Current Outpatient Medications on File Prior to Visit  Medication Sig Dispense Refill   glucose blood (ONETOUCH VERIO) test strip check CBG     atorvastatin (LIPITOR) 80 MG tablet Take 1 tablet (80 mg total) by mouth daily at 6 PM. 30 tablet 1   atorvastatin (LIPITOR) 80 MG tablet 1 tablet     calcium carbonate (OS-CAL) 600 MG TABS tablet See admin instructions.     Calcium Carbonate-Vit D-Min (CALCIUM 1200 PO) Take 1 tablet by mouth daily.     cholecalciferol (VITAMIN D) 25 MCG (1000 UT) tablet Take 1,000 Units by mouth daily.     citalopram (CELEXA) 20 MG tablet Take 20 mg by mouth daily.     clopidogrel (PLAVIX) 75 MG tablet Take 1 tablet (75 mg total) by mouth daily. 30 tablet 3   clopidogrel (PLAVIX) 75 MG tablet 1 tablet     escitalopram (LEXAPRO) 20 MG tablet 1 tablet     levothyroxine (SYNTHROID) 50 MCG tablet Take 50 mcg by mouth daily before breakfast.     OLANZapine (ZYPREXA) 10 MG tablet 1 tablet     PAXLOVID, 300/100, 20 x 150 MG & 10 x 100MG  TBPK Take by mouth.     traZODone (DESYREL) 50 MG tablet Take 50 mg by mouth at bedtime.     No current facility-administered medications on file prior to  visit.    No Known Allergies Social History   Occupational History   Not on file  Tobacco Use   Smoking status: Never   Smokeless tobacco: Never  Substance and Sexual Activity   Alcohol use: Never   Drug use: Not Currently   Sexual activity: Not Currently   Family History  Problem Relation Age of Onset   Asthma Other    Hypertension Other     Heart disease Other    Diabetes Other    Immunization History  Administered Date(s) Administered   Moderna Sars-Covid-2 Vaccination 04/10/2019, 05/08/2019     Review of Systems: Negative except as noted in the HPI.   Objective: There were no vitals filed for this visit.  [name] Day is a pleasant 70 y.o. female in NAD. AAO X 3.  Vascular Examination: CFT <3 seconds b/l LE. Palpable DP/PT pulses b/l LE. Digital hair sparse b/l. Skin temperature gradient WNL b/l. No pain with calf compression b/l. No edema noted b/l. No cyanosis or clubbing noted b/l LE.  Dermatological Examination: Pedal skin warm and supple b/l.  No open wounds b/l. No interdigital macerations. Toenails 1-5 b/l elongated, thickened, discolored with subungual debris. +Tenderness with dorsal palpation of nailplates. Hyperkeratotic lesion(s) noted plantar heel pad of both feet.  Porokeratotic lesion(s) noted submet head 5 b/l.  Musculoskeletal Examination: Normal muscle strength 5/5 to all lower extremity muscle groups bilaterally. HAV with bunion deformity noted b/l LE. Hammertoe deformity noted 2-5 b/l.Marland Kitchen No pain, crepitus or joint limitation noted with ROM b/l LE.  Patient ambulates independently without assistive aids.  Footwear Assessment: Does the patient wear appropriate shoes? Yes. Does the patient need inserts/orthotics? Yes.  Neurological Examination: Protective sensation intact 5/5 intact bilaterally with 10g monofilament b/l. Vibratory sensation intact b/l.  Assessment: 1. Pain due to onychomycosis of toenails of both feet   2. Porokeratosis   3. Callus   4. Diabetes mellitus type 2, diet-controlled (HCC)   5. Hallux valgus, acquired, bilateral   6. Encounter for diabetic foot exam ([place])     ADA Risk Categorization: Low Risk :  Patient has all of the following: Intact protective sensation No prior foot ulcer  No severe deformity Pedal pulses present  Plan: -Discussed diabetic shoe benefit  with [name] Day. She declines on today. -Diabetic foot examination performed today. -Continue foot and shoe inspections daily. Monitor blood glucose per PCP/Endocrinologist's recommendations. -Mycotic toenails 1-5 bilaterally were debrided in length and girth with sterile nail nippers and dremel without incident. -Callus(es) plantar heel pad of both feet pared utilizing sterile scalpel blade without complication or incident. Total number debrided =2. -Painful porokeratotic lesion(s) submet head 5 b/l pared and enucleated with sterile scalpel blade without incident. Total number of lesions debrided=2. -Patient/POA to call should there be question/concern in the interim.  Return in about 3 months (around 04/13/2021).  Marzetta Board, DPM

## 2021-01-19 ENCOUNTER — Other Ambulatory Visit: Payer: Self-pay | Admitting: Family Medicine

## 2021-01-19 DIAGNOSIS — F17210 Nicotine dependence, cigarettes, uncomplicated: Secondary | ICD-10-CM

## 2021-04-21 ENCOUNTER — Other Ambulatory Visit: Payer: Self-pay

## 2021-04-21 ENCOUNTER — Encounter: Payer: Self-pay | Admitting: Podiatry

## 2021-04-21 ENCOUNTER — Ambulatory Visit (INDEPENDENT_AMBULATORY_CARE_PROVIDER_SITE_OTHER): Payer: Medicare Other | Admitting: Podiatry

## 2021-04-21 DIAGNOSIS — E559 Vitamin D deficiency, unspecified: Secondary | ICD-10-CM | POA: Insufficient documentation

## 2021-04-21 DIAGNOSIS — Q828 Other specified congenital malformations of skin: Secondary | ICD-10-CM | POA: Diagnosis not present

## 2021-04-21 DIAGNOSIS — B351 Tinea unguium: Secondary | ICD-10-CM | POA: Diagnosis not present

## 2021-04-21 DIAGNOSIS — L84 Corns and callosities: Secondary | ICD-10-CM | POA: Diagnosis not present

## 2021-04-21 DIAGNOSIS — M79675 Pain in left toe(s): Secondary | ICD-10-CM | POA: Diagnosis not present

## 2021-04-21 DIAGNOSIS — E119 Type 2 diabetes mellitus without complications: Secondary | ICD-10-CM | POA: Diagnosis not present

## 2021-04-21 DIAGNOSIS — M79674 Pain in right toe(s): Secondary | ICD-10-CM

## 2021-04-27 NOTE — Progress Notes (Signed)
?  Subjective:  ?Patient ID: [name] Day, female    DOB: 11/19/1950,  MRN: [id number] ? ?[name] Day presents to clinic today for preventative diabetic foot care, callus(es) bilateral heels. Aggravating factors include weightbearing with and without shoe gear. Pain is relieved with periodic professional debridement. She also has painful porokeratotic lesion(s) bilaterally and painful mycotic toenails that limit ambulation. Painful toenails interfere with ambulation. Aggravating factors include wearing enclosed shoe gear. Pain is relieved with periodic professional debridement. Painful porokeratotic lesions are aggravated when weightbearing with and without shoegear. Pain is relieved with periodic professional debridement. ? ?New problem(s): None.  ? ?PCP is [name] Palmer, MD , and last visit was January 15, 2021. ? ?No Known Allergies ? ?Review of Systems: Negative except as noted in the HPI. ? ?Objective: No changes noted in today's physical examination. ?There were no vitals filed for this visit. ? ?[name] Day is a pleasant 71 y.o. female in NAD. AAO X 3. ? ?Vascular Examination: ?CFT <3 seconds b/l LE. Palpable DP/PT pulses b/l LE. Digital hair sparse b/l. Skin temperature gradient WNL b/l. No pain with calf compression b/l. No edema noted b/l. No cyanosis or clubbing noted b/l LE. ? ?Dermatological Examination: ?Pedal skin warm and supple b/l.  No open wounds b/l. No interdigital macerations. Toenails 1-5 b/l elongated, thickened, discolored with subungual debris. +Tenderness with dorsal palpation of nailplates. Hyperkeratotic lesion(s) noted plantar heel pad of both feet.  Porokeratotic lesion(s) noted submet head 5 b/l. ? ?Musculoskeletal Examination: ?Normal muscle strength 5/5 to all lower extremity muscle groups bilaterally. HAV with bunion deformity noted b/l LE. Hammertoe deformity noted 2-5 b/l.[name] Day No pain, crepitus or joint limitation noted with ROM b/l LE.  Patient ambulates  independently without assistive aids. ? ?Neurological Examination: ?Protective sensation intact 5/5 intact bilaterally with 10g monofilament b/l. Vibratory sensation intact b/l. ? ?Assessment/Plan: ?1. Pain due to onychomycosis of toenails of both feet   ?2. Porokeratosis   ?3. Callus   ?4. Diabetes mellitus type 2, diet-controlled (HCC)   ?-Examined patient. ?-Toenails 1-5 b/l were debrided in length and girth with sterile nail nippers and dremel without iatrogenic bleeding.  ?-Callus(es) plantar heel pad of both feet pared utilizing sterile scalpel blade without complication or incident. Total number debrided =2. ?-Painful porokeratotic lesion(s) submet head 5 b/l pared and enucleated with sterile scalpel blade without incident. Total number of lesions debrided=2. ?-Patient/POA to call should there be question/concern in the interim.  ? ?Return in about 3 months (around 07/22/2021). ? ?[name] Day, DPM  ?

## 2021-06-02 ENCOUNTER — Ambulatory Visit (HOSPITAL_COMMUNITY): Admission: RE | Admit: 2021-06-02 | Payer: Medicare Other | Source: Home / Self Care | Admitting: Gastroenterology

## 2021-06-02 ENCOUNTER — Encounter (HOSPITAL_COMMUNITY): Admission: RE | Payer: Self-pay | Source: Home / Self Care

## 2021-06-02 SURGERY — COLONOSCOPY WITH PROPOFOL
Anesthesia: Monitor Anesthesia Care

## 2021-06-19 ENCOUNTER — Other Ambulatory Visit: Payer: Self-pay | Admitting: Gastroenterology

## 2021-07-22 ENCOUNTER — Encounter: Payer: Self-pay | Admitting: Podiatry

## 2021-07-22 ENCOUNTER — Ambulatory Visit (INDEPENDENT_AMBULATORY_CARE_PROVIDER_SITE_OTHER): Payer: Medicare Other | Admitting: Podiatry

## 2021-07-22 DIAGNOSIS — M79675 Pain in left toe(s): Secondary | ICD-10-CM

## 2021-07-22 DIAGNOSIS — Q828 Other specified congenital malformations of skin: Secondary | ICD-10-CM

## 2021-07-22 DIAGNOSIS — E119 Type 2 diabetes mellitus without complications: Secondary | ICD-10-CM | POA: Diagnosis not present

## 2021-07-22 DIAGNOSIS — B351 Tinea unguium: Secondary | ICD-10-CM | POA: Diagnosis not present

## 2021-07-22 DIAGNOSIS — M79674 Pain in right toe(s): Secondary | ICD-10-CM | POA: Diagnosis not present

## 2021-07-22 DIAGNOSIS — L84 Corns and callosities: Secondary | ICD-10-CM

## 2021-07-27 NOTE — Progress Notes (Signed)
  Subjective:  Patient ID: [name] Day, female    DOB: [birth year],  MRN: [id number]  [name] Day presents to clinic today for preventative diabetic foot care and callus(es) b/l lower extremities and painful thick toenails that are difficult to trim. Painful toenails interfere with ambulation. Aggravating factors include wearing enclosed shoe gear. Pain is relieved with periodic professional debridement. Painful calluses are aggravated when weightbearing with and without shoegear. Pain is relieved with periodic professional debridement.    Last known HgA1c was unknown. Patient did not check blood glucose today.  New problem(s): None.   PCP is Mila Palmer, MD , and last visit was September, 2022.  No Known Allergies  Review of Systems: Negative except as noted in the HPI.  Objective: No changes noted in today's physical examination. There were no vitals filed for this visit.  [name] Day is a pleasant 71 y.o. female in NAD. AAO X 3.  Vascular Examination: CFT <3 seconds b/l LE. Palpable DP/PT pulses b/l LE. Digital hair sparse b/l. Skin temperature gradient WNL b/l. No pain with calf compression b/l. No edema noted b/l. No cyanosis or clubbing noted b/l LE.  Dermatological Examination: Pedal skin warm and supple b/l.  No open wounds b/l. No interdigital macerations. Toenails 1-5 b/l elongated, thickened, discolored with subungual debris. +Tenderness with dorsal palpation of nailplates. Hyperkeratotic lesion(s) noted plantar heel pad of both feet.  Porokeratotic lesion(s) noted submet head 5 b/l.  Musculoskeletal Examination: Normal muscle strength 5/5 to all lower extremity muscle groups bilaterally. HAV with bunion deformity noted b/l LE. Hammertoe deformity noted 2-5 b/l.Marland Kitchen No pain, crepitus or joint limitation noted with ROM b/l LE.  Patient ambulates independently without assistive aids.  Neurological Examination: Protective sensation intact 5/5 intact bilaterally  with 10g monofilament b/l. Vibratory sensation intact b/l.  Assessment/Plan: 1. Pain due to onychomycosis of toenails of both feet   2. Porokeratosis   3. Callus   4. Diabetes mellitus type 2, diet-controlled (HCC)      -Patient was evaluated and treated. All patient's and/or POA's questions/concerns answered on today's visit. -Continue foot and shoe inspections daily. Monitor blood glucose per PCP/Endocrinologist's recommendations. -Toenails 1-5 b/l were debrided in length and girth with sterile nail nippers and dremel without iatrogenic bleeding.  -Callus(es) bilateral heels pared utilizing sterile scalpel blade without complication or incident. Total number debrided =2. -Porokeratotic lesion(s) submet head 5 b/l pared and enucleated with sterile scalpel blade without incident. Total number of lesions debrided=2. -Patient/POA to call should there be question/concern in the interim.   Return in about 3 months (around 10/22/2021).  [name], DPM

## 2021-09-24 ENCOUNTER — Other Ambulatory Visit: Payer: Self-pay | Admitting: Family Medicine

## 2021-09-24 DIAGNOSIS — Z1231 Encounter for screening mammogram for malignant neoplasm of breast: Secondary | ICD-10-CM

## 2021-10-05 ENCOUNTER — Encounter (HOSPITAL_COMMUNITY): Payer: Self-pay | Admitting: Gastroenterology

## 2021-10-05 NOTE — Progress Notes (Signed)
Attempted to obtain medical history via telephone, unable to reach at this time. HIPAA compliant voicemail message left requesting return call to pre surgical testing department. 

## 2021-10-13 ENCOUNTER — Ambulatory Visit (HOSPITAL_BASED_OUTPATIENT_CLINIC_OR_DEPARTMENT_OTHER): Payer: Medicare Other | Admitting: Anesthesiology

## 2021-10-13 ENCOUNTER — Encounter (HOSPITAL_COMMUNITY)
Admission: RE | Disposition: A | Discharge status: Home / Self Care | Payer: Self-pay | Source: Home / Self Care | Attending: Gastroenterology

## 2021-10-13 ENCOUNTER — Encounter (HOSPITAL_COMMUNITY): Payer: Self-pay | Admitting: Gastroenterology

## 2021-10-13 ENCOUNTER — Ambulatory Visit (HOSPITAL_COMMUNITY)
Admission: RE | Admit: 2021-10-13 | Discharge: 2021-10-13 | Disposition: A | Discharge status: Home / Self Care | Payer: Medicare Other | Attending: Gastroenterology | Admitting: Gastroenterology

## 2021-10-13 ENCOUNTER — Ambulatory Visit (HOSPITAL_COMMUNITY): Payer: Medicare Other | Admitting: Anesthesiology

## 2021-10-13 ENCOUNTER — Other Ambulatory Visit: Payer: Self-pay

## 2021-10-13 DIAGNOSIS — K635 Polyp of colon: Secondary | ICD-10-CM | POA: Insufficient documentation

## 2021-10-13 DIAGNOSIS — I739 Peripheral vascular disease, unspecified: Secondary | ICD-10-CM | POA: Insufficient documentation

## 2021-10-13 DIAGNOSIS — K621 Rectal polyp: Secondary | ICD-10-CM | POA: Diagnosis not present

## 2021-10-13 DIAGNOSIS — Z8673 Personal history of transient ischemic attack (TIA), and cerebral infarction without residual deficits: Secondary | ICD-10-CM | POA: Insufficient documentation

## 2021-10-13 DIAGNOSIS — I10 Essential (primary) hypertension: Secondary | ICD-10-CM | POA: Insufficient documentation

## 2021-10-13 DIAGNOSIS — Z1211 Encounter for screening for malignant neoplasm of colon: Secondary | ICD-10-CM

## 2021-10-13 DIAGNOSIS — J449 Chronic obstructive pulmonary disease, unspecified: Secondary | ICD-10-CM | POA: Diagnosis not present

## 2021-10-13 DIAGNOSIS — K5730 Diverticulosis of large intestine without perforation or abscess without bleeding: Secondary | ICD-10-CM | POA: Insufficient documentation

## 2021-10-13 DIAGNOSIS — Z139 Encounter for screening, unspecified: Secondary | ICD-10-CM | POA: Diagnosis not present

## 2021-10-13 DIAGNOSIS — K640 First degree hemorrhoids: Secondary | ICD-10-CM | POA: Insufficient documentation

## 2021-10-13 DIAGNOSIS — F17290 Nicotine dependence, other tobacco product, uncomplicated: Secondary | ICD-10-CM | POA: Diagnosis not present

## 2021-10-13 HISTORY — PX: BIOPSY: SHX5522

## 2021-10-13 HISTORY — PX: COLONOSCOPY WITH PROPOFOL: SHX5780

## 2021-10-13 SURGERY — COLONOSCOPY WITH PROPOFOL
Anesthesia: Monitor Anesthesia Care

## 2021-10-13 MED ORDER — LACTATED RINGERS IV SOLN: INTRAVENOUS | Status: DC

## 2021-10-13 MED ORDER — PROPOFOL 10 MG/ML IV BOLUS
INTRAVENOUS | Status: DC | PRN
  Administered 2021-10-13: 40 mg via INTRAVENOUS

## 2021-10-13 MED ORDER — PROPOFOL 500 MG/50ML IV EMUL
INTRAVENOUS | Status: DC | PRN
  Administered 2021-10-13: 100 ug/kg/min via INTRAVENOUS

## 2021-10-13 MED ORDER — SODIUM CHLORIDE 0.9 % IV SOLN: INTRAVENOUS | Status: DC

## 2021-10-13 MED ORDER — LIDOCAINE 2% (20 MG/ML) 5 ML SYRINGE
INTRAMUSCULAR | Status: DC | PRN
  Administered 2021-10-13: 40 mg via INTRAVENOUS

## 2021-10-13 SURGICAL SUPPLY — 22 items

## 2021-10-13 NOTE — Anesthesia Postprocedure Evaluation (Signed)
Anesthesia Post Note  Patient: [name] Day  Procedure(s) Performed: COLONOSCOPY WITH PROPOFOL BIOPSY     Patient location during evaluation: PACU Anesthesia Type: MAC Level of consciousness: awake and alert Pain management: pain level controlled Vital Signs Assessment: post-procedure vital signs reviewed and stable Respiratory status: spontaneous breathing, nonlabored ventilation, respiratory function stable and patient connected to nasal cannula oxygen Cardiovascular status: stable and blood pressure returned to baseline Postop Assessment: no apparent nausea or vomiting Anesthetic complications: no   No notable events documented.  Last Vitals:  Vitals:   10/13/21 1000 10/13/21 1007  BP: 128/67 125/71  Pulse: (!) 50 (!) 49  Resp: (!) 22 17  Temp:    SpO2: 100% 94%    Last Pain:  Vitals:   10/13/21 0950  TempSrc:   PainSc: 0-No pain                 Shawne Bulow S

## 2021-10-13 NOTE — Anesthesia Procedure Notes (Signed)
Procedure Name: MAC Date/Time: 10/13/2021 9:12 AM  Performed by: Cynda Familia, CRNAPre-anesthesia Checklist: Patient identified, Emergency Drugs available, Suction available, Patient being monitored and Timeout performed Oxygen Delivery Method: Simple face mask Placement Confirmation: positive ETCO2 and breath sounds checked- equal and bilateral Dental Injury: Teeth and Oropharynx as per pre-operative assessment

## 2021-10-13 NOTE — Transfer of Care (Signed)
Immediate Anesthesia Transfer of Care Note  Patient: EMIYAH SPRAGGINS  Procedure(s) Performed: COLONOSCOPY WITH PROPOFOL BIOPSY  Patient Location: PACU and Endoscopy Unit  Anesthesia Type:MAC  Level of Consciousness: sedated  Airway & Oxygen Therapy: Patient Spontanous Breathing and Patient connected to face mask oxygen  Post-op Assessment: Report given to RN and Post -op Vital signs reviewed and stable  Post vital signs: Reviewed and stable  Last Vitals:  Vitals Value Taken Time  BP    Temp    Pulse 51 10/13/21 0943  Resp 21 10/13/21 0943  SpO2 100 % 10/13/21 0943  Vitals shown include unvalidated device data.  Last Pain:  Vitals:   10/13/21 0836  TempSrc: Temporal  PainSc: 0-No pain         Complications: No notable events documented.

## 2021-10-13 NOTE — H&P (Signed)
Date of Initial H&P: 10/02/21  History reviewed, patient examined, no change in status, stable for surgery.

## 2021-10-13 NOTE — Interval H&P Note (Signed)
History and Physical Interval Note:  10/13/2021 9:08 AM  [name] Day  has presented today for surgery, with the diagnosis of Screening.  The various methods of treatment have been discussed with the patient and family. After consideration of risks, benefits and other options for treatment, the patient has consented to  Procedure(s): COLONOSCOPY WITH PROPOFOL (N/A) as a surgical intervention.  The patient's history has been reviewed, patient examined, no change in status, stable for surgery.  I have reviewed the patient's chart and labs.  Questions were answered to the patient's satisfaction.     Shirley Friar

## 2021-10-13 NOTE — Anesthesia Preprocedure Evaluation (Signed)
Anesthesia Evaluation  Patient identified by MRN, date of birth, ID band Patient awake    Reviewed: Allergy & Precautions, NPO status , Patient's Chart, lab work & pertinent test results  Airway Mallampati: II  TM Distance: >3 FB Neck ROM: Full    Dental no notable dental hx.    Pulmonary COPD,    Pulmonary exam normal breath sounds clear to auscultation       Cardiovascular hypertension, + Peripheral Vascular Disease  Normal cardiovascular exam Rhythm:Regular Rate:Normal     Neuro/Psych Schizophrenia CVA    GI/Hepatic negative GI ROS, Neg liver ROS,   Endo/Other  diabetesHypothyroidism   Renal/GU Renal InsufficiencyRenal disease  negative genitourinary   Musculoskeletal negative musculoskeletal ROS (+)   Abdominal   Peds negative pediatric ROS (+)  Hematology negative hematology ROS (+)   Anesthesia Other Findings   Reproductive/Obstetrics negative OB ROS                             Anesthesia Physical Anesthesia Plan  ASA: 3  Anesthesia Plan: MAC   Post-op Pain Management: Minimal or no pain anticipated   Induction: Intravenous  PONV Risk Score and Plan: 2 and Propofol infusion and Treatment may vary due to age or medical condition  Airway Management Planned: Simple Face Mask  Additional Equipment:   Intra-op Plan:   Post-operative Plan:   Informed Consent: I have reviewed the patients History and Physical, chart, labs and discussed the procedure including the risks, benefits and alternatives for the proposed anesthesia with the patient or authorized representative who has indicated his/her understanding and acceptance.     Dental advisory given  Plan Discussed with: CRNA and Surgeon  Anesthesia Plan Comments:         Anesthesia Quick Evaluation

## 2021-10-13 NOTE — Op Note (Signed)
[facility] Patient Name: [name] Day Procedure Date: 10/13/2021 MRN: [id number] Attending MD: Lear Ng , MD Date of Birth: 1950/11/06 CSN: [id number] Age: 71 Admit Type: Outpatient Procedure:                Colonoscopy Indications:              Screening for colorectal malignant neoplasm, Last                            colonoscopy: September 2012 Providers:                Lear Ng, MD, Ladoris Gene, RN, Theodora Blow, Technician Referring MD:              Medicines:                Propofol per Anesthesia, Monitored Anesthesia Care Complications:            No immediate complications. Estimated Blood Loss:     Estimated blood loss was minimal. Procedure:                Pre-Anesthesia Assessment:                           - Prior to the procedure, a History and Physical                            was performed, and patient medications and                            allergies were reviewed. The patient's tolerance of                            previous anesthesia was also reviewed. The risks                            and benefits of the procedure and the sedation                            options and risks were discussed with the patient.                            All questions were answered, and informed consent                            was obtained. Prior Anticoagulants: The patient has                            taken Plavix (clopidogrel), last dose was 5 days                            prior to procedure. ASA Grade Assessment: III - A  patient with severe systemic disease. After                            reviewing the risks and benefits, the patient was                            deemed in satisfactory condition to undergo the                            procedure.                           After obtaining informed consent, the colonoscope                            was passed under  direct vision. Throughout the                            procedure, the patient's blood pressure, pulse, and                            oxygen saturations were monitored continuously. The                            [id number]-HQ190L ([id number]) Olympus colonoscope was                            introduced through the anus and advanced to the the                            cecum, identified by appendiceal orifice and                            ileocecal valve. The colonoscopy was performed                            without difficulty. The patient tolerated the                            procedure well. The quality of the bowel                            preparation was fair. Scope In: 9:21:29 AM Scope Out: 9:37:50 AM Scope Withdrawal Time: 0 hours 9 minutes 1 second  Total Procedure Duration: 0 hours 16 minutes 21 seconds  Findings:      The perianal and digital rectal examinations were normal.      Five sessile and semi-sessile polyps were found in the rectum and       sigmoid colon. The polyps were 1 to 4 mm in size. These polyps were       removed with a cold biopsy forceps. Resection and retrieval were       complete. Estimated blood loss was minimal.      Multiple small and large-mouthed diverticula were found in the entire       colon.  Internal hemorrhoids were found during retroflexion. The hemorrhoids       were medium-sized and Grade I (internal hemorrhoids that do not       prolapse).      The terminal ileum appeared normal.      A moderate amount of semi-liquid semi-solid solid stool was found in the       rectum, in the sigmoid colon and in the descending colon, making       visualization difficult. Lavage of the area was performed, resulting in       clearance with fair visualization. Impression:               - Preparation of the colon was fair.                           - Five 1 to 4 mm polyps in the rectum and in the                            sigmoid colon, removed with a  cold biopsy forceps.                            Resected and retrieved.                           - Diverticulosis in the entire examined colon.                           - Internal hemorrhoids.                           - The examined portion of the ileum was normal.                           - Stool in the rectum, in the sigmoid colon and in                            the descending colon. Moderate Sedation:      Not Applicable - Patient had care per Anesthesia. Recommendation:           - Patient has a contact number available for                            emergencies. The signs and symptoms of potential                            delayed complications were discussed with the                            patient. Return to normal activities tomorrow.                            Written discharge instructions were provided to the                            patient.                           -  High fiber diet.                           - Resume Plavix (clopidogrel) at prior dose in 2                            days.                           - Await pathology results.                           - Repeat colonoscopy for surveillance based on                            pathology results. Procedure Code(s):        --- Professional ---                           [phone], Colonoscopy, flexible; with biopsy, single                            or multiple Diagnosis Code(s):        --- Professional ---                           Z12.11, Encounter for screening for malignant                            neoplasm of colon                           K62.1, Rectal polyp                           K63.5, Polyp of colon                           K64.0, First degree hemorrhoids                           K57.30, Diverticulosis of large intestine without                            perforation or abscess without bleeding CPT copyright 2019 [employer]. All rights reserved. The codes documented  in this report are preliminary and upon coder review may  be revised to meet current compliance requirements. Shirley Friar, MD 10/13/2021 9:51:50 AM This report has been signed electronically. Number of Addenda: 0

## 2021-10-13 NOTE — Discharge Instructions (Addendum)
HOLD Plavix another 2 days and start back on Thursday 10/15/21.  YOU HAD AN ENDOSCOPIC PROCEDURE TODAY: Refer to the procedure report and other information in the discharge instructions given to you for any specific questions about what was found during the examination. If this information does not answer your questions, please call [facility] office at [phone] to clarify.   YOU SHOULD EXPECT: Some feelings of bloating in the abdomen. Passage of more gas than usual. Walking can help get rid of the air that was put into your GI tract during the procedure and reduce the bloating. If you had a lower endoscopy (such as a colonoscopy or flexible sigmoidoscopy) you may notice spotting of blood in your stool or on the toilet paper. Some abdominal soreness may be present for a day or two, also.  DIET: Your first meal following the procedure should be a light meal and then it is ok to progress to your normal diet. A half-sandwich or bowl of soup is an example of a good first meal. Heavy or fried foods are harder to digest and may make you feel nauseous or bloated. Drink plenty of fluids but you should avoid alcoholic beverages for 24 hours. If you had a esophageal dilation, please see attached instructions for diet.    ACTIVITY: Your care partner should take you home directly after the procedure. You should plan to take it easy, moving slowly for the rest of the day. You can resume normal activity the day after the procedure however YOU SHOULD NOT DRIVE, use power tools, machinery or perform tasks that involve climbing or major physical exertion for 24 hours (because of the sedation medicines used during the test).   SYMPTOMS TO REPORT IMMEDIATELY: A gastroenterologist can be reached at any hour. Please call [phone]  for any of the following symptoms:  Following lower endoscopy (colonoscopy, flexible sigmoidoscopy) Excessive amounts of blood in the stool  Significant tenderness, worsening of abdominal  pains  Swelling of the abdomen that is new, acute  Fever of 100 or higher   FOLLOW UP:  If any biopsies were taken you will be contacted by phone or by letter within the next 1-3 weeks. Call [phone]  if you have not heard about the biopsies in 3 weeks.  Please also call with any specific questions about appointments or follow up tests. YOU HAD AN ENDOSCOPIC PROCEDURE TODAY: Refer to the procedure report and other information in the discharge instructions given to you for any specific questions about what was found during the examination. If this information does not answer your questions, please call [facility] office at [phone] to clarify.

## 2021-10-14 LAB — SURGICAL PATHOLOGY

## 2021-10-15 ENCOUNTER — Encounter (HOSPITAL_COMMUNITY): Payer: Self-pay | Admitting: Gastroenterology

## 2021-11-02 ENCOUNTER — Ambulatory Visit (INDEPENDENT_AMBULATORY_CARE_PROVIDER_SITE_OTHER): Payer: Medicare Other | Admitting: Podiatry

## 2021-11-02 ENCOUNTER — Ambulatory Visit: Payer: Medicare Other

## 2021-11-02 ENCOUNTER — Encounter: Payer: Self-pay | Admitting: Podiatry

## 2021-11-02 DIAGNOSIS — B351 Tinea unguium: Secondary | ICD-10-CM | POA: Diagnosis not present

## 2021-11-02 DIAGNOSIS — Q828 Other specified congenital malformations of skin: Secondary | ICD-10-CM | POA: Diagnosis not present

## 2021-11-02 DIAGNOSIS — M79675 Pain in left toe(s): Secondary | ICD-10-CM

## 2021-11-02 DIAGNOSIS — E119 Type 2 diabetes mellitus without complications: Secondary | ICD-10-CM | POA: Diagnosis not present

## 2021-11-02 DIAGNOSIS — M79674 Pain in right toe(s): Secondary | ICD-10-CM | POA: Diagnosis not present

## 2021-11-02 NOTE — Progress Notes (Signed)
  Subjective:  Patient ID: [name] Day, female    DOB: [birth year],  MRN: [id number]  SHAUN ZUCCARO presents to clinic today for: Chief Complaint  Patient presents with   Nail Problem    Diabetic foot care BS- Do not check A1C-Do not know PCP-[facility]ters PCP VST-10/22   Objective: No changes noted in today's physical examination. [name] Day is a pleasant 71 y.o. female in NAD. AAO x 3.  Vascular Examination: CFT <3 seconds b/l LE. Palpable DP/PT pulses b/l LE. Digital hair sparse b/l. Skin temperature gradient WNL b/l. No pain with calf compression b/l. No edema noted b/l. No cyanosis or clubbing noted b/l LE.  Dermatological Examination: Pedal skin warm and supple b/l.  No open wounds b/l. No interdigital macerations. Toenails 1-5 b/l elongated, thickened, discolored with subungual debris. +Tenderness with dorsal palpation of nailplates. Hyperkeratotic lesion(s) noted plantar heel pad of both feet.  Porokeratotic lesion(s) noted submet head 5 b/l.  Musculoskeletal Examination: Normal muscle strength 5/5 to all lower extremity muscle groups bilaterally. HAV with bunion deformity noted b/l LE. Hammertoe deformity noted 2-5 b/l.Marland Kitchen No pain, crepitus or joint limitation noted with ROM b/l LE.  Patient ambulates independently without assistive aids.  Neurological Examination: Protective sensation intact 5/5 intact bilaterally with 10g monofilament b/l. Vibratory sensation intact b/l.  Assessment/Plan: 1. Pain due to onychomycosis of toenails of both feet   2. Porokeratosis   3. Diabetes mellitus type 2, diet-controlled ([place])     -Consent given for treatment as described below: -Examined patient. -Medicaid ABN signed for services of paring of corn(s)/callus(es)/porokeratos(es) today. Copy in patient chart. -Continue foot and shoe inspections daily. Monitor blood glucose per PCP/Endocrinologist's recommendations. -Patient to continue soft, supportive shoe gear daily. -Toenails  1-5 b/l were debrided in length and girth with sterile nail nippers and dremel without iatrogenic bleeding.  -Porokeratotic lesion(s) plantar heel pad of both feet and submet head 5 b/l pared and enucleated with sterile currette without incident. Total number of lesions debrided=4. -Patient/POA to call should there be question/concern in the interim.   Return in about 3 months (around 02/01/2022).  Marzetta Board, DPM

## 2021-11-03 ENCOUNTER — Ambulatory Visit: Payer: Medicare Other

## 2021-11-12 ENCOUNTER — Ambulatory Visit
Admission: RE | Admit: 2021-11-12 | Discharge: 2021-11-12 | Disposition: A | Discharge status: Home / Self Care | Payer: Medicare Other | Source: Ambulatory Visit | Attending: Family Medicine | Admitting: Family Medicine

## 2021-11-12 ENCOUNTER — Ambulatory Visit: Payer: Medicare Other

## 2021-11-12 DIAGNOSIS — Z1231 Encounter for screening mammogram for malignant neoplasm of breast: Secondary | ICD-10-CM | POA: Diagnosis not present

## 2022-01-08 ENCOUNTER — Other Ambulatory Visit: Payer: Self-pay | Admitting: Family Medicine

## 2022-01-08 DIAGNOSIS — F17200 Nicotine dependence, unspecified, uncomplicated: Secondary | ICD-10-CM

## 2022-02-17 ENCOUNTER — Ambulatory Visit (INDEPENDENT_AMBULATORY_CARE_PROVIDER_SITE_OTHER): Payer: Medicare Other | Admitting: Podiatry

## 2022-02-17 VITALS — BP 126/61

## 2022-02-17 DIAGNOSIS — M79674 Pain in right toe(s): Secondary | ICD-10-CM | POA: Diagnosis not present

## 2022-02-17 DIAGNOSIS — M2012 Hallux valgus (acquired), left foot: Secondary | ICD-10-CM | POA: Diagnosis not present

## 2022-02-17 DIAGNOSIS — E119 Type 2 diabetes mellitus without complications: Secondary | ICD-10-CM

## 2022-02-17 DIAGNOSIS — L84 Corns and callosities: Secondary | ICD-10-CM

## 2022-02-17 DIAGNOSIS — Q828 Other specified congenital malformations of skin: Secondary | ICD-10-CM

## 2022-02-17 DIAGNOSIS — M79675 Pain in left toe(s): Secondary | ICD-10-CM

## 2022-02-17 DIAGNOSIS — B351 Tinea unguium: Secondary | ICD-10-CM

## 2022-02-17 DIAGNOSIS — M2011 Hallux valgus (acquired), right foot: Secondary | ICD-10-CM

## 2022-02-17 NOTE — Progress Notes (Signed)
ANNUAL DIABETIC FOOT EXAM  Subjective: [name] Day presents today for annual diabetic foot examination.  Chief Complaint  Patient presents with   Nail Problem    DFC BS-do not check A1C-do not know PCP-[facility]ters, Ivin Booty PCP VST- Fall 2023   Patient confirms h/o diabetes.  Patient denies any h/o foot wounds.  Risk factors: diabetes, h/o CVA, HTN, CAD, COPD, CKD, hyperlipidemia, diabetic renal disease.  Jonathon Jordan, MD is patient's PCP.  Past Medical History:  Diagnosis Date   Hyperlipidemia    Hypothyroidism    Schizoaffective disorder ([place])    Tobacco abuse    Patient Active Problem List   Diagnosis Date Noted   Special screening for malignant neoplasms, colon 10/13/2021   Vitamin D deficiency 04/21/2021   Body mass index (BMI) 27.0-27.9, adult 01/18/2021   Chronic kidney disease, stage 2 (mild) 01/18/2021   Chronic obstructive pulmonary disease, unspecified ([place]) 01/18/2021   Coronary artery disease 01/18/2021   Decreased estrogen level 01/18/2021   Diabetic renal disease ([place]) 01/18/2021   Encounter for general adult medical examination without abnormal findings 01/18/2021   Essential hypertension 01/18/2021   Hardening of the aorta (main artery of the heart) ([place]) 01/18/2021   Hypertensive retinopathy 01/18/2021   Osteoporosis 01/18/2021   Otitis externa 01/18/2021   Pain in right ankle and joints of right foot 01/18/2021   Personal history of transient ischemic attack (TIA), and cerebral infarction without residual deficits 01/18/2021   Protein-calorie malnutrition ([place]) 01/18/2021   Pure hypercholesterolemia 01/18/2021   Senile purpura ([place]) 01/18/2021   Cigarette nicotine dependence without complication 58/10/9831   Type 2 diabetes mellitus with mild nonproliferative diabetic retinopathy without macular edema, bilateral ([place]) 01/18/2021   Cerebral infarction ([place])    CVA (cerebral vascular accident) ([place]) 08/04/2018   Tobacco abuse    Hypothyroidism     Schizoaffective disorder ([place])    Hyperlipidemia    Abnormal auditory perception of right ear 05/31/2016   [place] tongue 05/31/2016   Past Surgical History:  Procedure Laterality Date   BIOPSY  10/13/2021   Procedure: BIOPSY;  Surgeon: Wilford Corner, MD;  Location: [facility];  Service: Gastroenterology;;   COLONOSCOPY WITH PROPOFOL N/A 10/13/2021   Procedure: COLONOSCOPY WITH PROPOFOL;  Surgeon: Wilford Corner, MD;  Location: [facility];  Service: Gastroenterology;  Laterality: N/A;   Current Outpatient Medications on File Prior to Visit  Medication Sig Dispense Refill   atorvastatin (LIPITOR) 80 MG tablet Take 1 tablet (80 mg total) by mouth daily at 6 PM. (Patient taking differently: Take 80 mg by mouth in the morning.) 30 tablet 1   Calcium Carb-Cholecalciferol (CALCIUM + D3 PO) Take 1 tablet by mouth in the morning.     clopidogrel (PLAVIX) 75 MG tablet Take 1 tablet (75 mg total) by mouth daily. 30 tablet 3   escitalopram (LEXAPRO) 20 MG tablet Take 20 mg by mouth in the morning.     glucose blood (ONETOUCH VERIO) test strip check CBG     levothyroxine (SYNTHROID) 50 MCG tablet Take 50 mcg by mouth daily before breakfast.     OLANZapine (ZYPREXA) 10 MG tablet Take 10 mg by mouth at bedtime.     polyethylene glycol-electrolytes (NULYTELY) 420 g solution MIX AND DRINK AS DIRECTED     traZODone (DESYREL) 50 MG tablet Take 50 mg by mouth at bedtime.     No current facility-administered medications on file prior to visit.    No Known Allergies Social History   Occupational History   Not  on file  Tobacco Use   Smoking status: Never   Smokeless tobacco: Never  Substance and Sexual Activity   Alcohol use: Never   Drug use: Not Currently   Sexual activity: Not Currently   Family History  Problem Relation Age of Onset   Asthma Other    Hypertension Other    Heart disease Other    Diabetes Other    Immunization History  Administered Date(s) Administered    Moderna Sars-Covid-2 Vaccination 04/10/2019, 05/08/2019     Review of Systems: Negative except as noted in the HPI.   Objective: Vitals:   02/17/22 1402  BP: 126/61   [name] Day is a pleasant 72 y.o. female in NAD. AAO X 3.  Vascular Examination: CFT <3 seconds b/l LE. Palpable pedal pulses b/l LE. Pedal hair absent. No pain with calf compression b/l. No edema noted b/l LE. No cyanosis or clubbing noted b/l LE.  Dermatological Examination: Pedal integument with normal turgor, texture and tone BLE. No open wounds b/l LE. No interdigital macerations noted b/l LE. Toenails 1-5 b/l elongated, discolored, dystrophic, thickened, crumbly with subungual debris and tenderness to dorsal palpation. Hyperkeratotic lesion(s) bilateral heels.  No erythema, no edema, no drainage, no fluctuance. Porokeratotic lesion(s) submet head 5 b/l. No erythema, no edema, no drainage, no fluctuance.  Neurological Examination: Protective sensation intact 5/5 intact bilaterally with 10g monofilament b/l. Vibratory sensation intact b/l. Proprioception intact bilaterally.  Musculoskeletal Examination: Normal muscle strength 5/5 to all lower extremity muscle groups bilaterally. HAV with bunion bilaterally and hammertoes 2-5 b/l.Marland Kitchen No pain, crepitus or joint limitation noted with ROM b/l LE.  Patient ambulates independently without assistive aids.  Footwear Assessment: Does the patient wear appropriate shoes? Yes. Does the patient need inserts/orthotics? Yes.  ADA Risk Categorization: Low Risk :  Patient has all of the following: Intact protective sensation No prior foot ulcer  No severe deformity Pedal pulses present  Assessment: 1. Pain due to onychomycosis of toenails of both feet   2. Porokeratosis   3. Callus   4. Hallux valgus, acquired, bilateral   5. Diabetes mellitus type 2, diet-controlled ([place])   6. Encounter for diabetic foot exam ([place])     Plan: No orders of the defined types were placed  in this encounter.  -Patient was evaluated and treated. All patient's and/or POA's questions/concerns answered on today's visit. -Diabetic foot examination performed today. -Continue diabetic foot care principles: inspect feet daily, monitor glucose as recommended by PCP and/or Endocrinologist, and follow prescribed diet per PCP, Endocrinologist and/or dietician. -Continue supportive shoe gear daily. -Mycotic toenails 1-5 bilaterally were debrided in length and girth with sterile nail nippers and dremel without incident. -Callus(es) bilateral heels pared utilizing rotary bur without complication or incident. Total number pared =2. -Porokeratotic lesion(s) submet head 5 b/l pared and enucleated with sterile currette without incident. Total number of lesions debrided=2. -Patient/POA to call should there be question/concern in the interim. Return in about 3 months (around 05/19/2022).  Marzetta Board, DPM

## 2022-02-22 ENCOUNTER — Encounter: Payer: Self-pay | Admitting: Podiatry

## 2022-04-02 DIAGNOSIS — D692 Other nonthrombocytopenic purpura: Secondary | ICD-10-CM | POA: Diagnosis not present

## 2022-04-02 DIAGNOSIS — Z0000 Encounter for general adult medical examination without abnormal findings: Secondary | ICD-10-CM | POA: Diagnosis not present

## 2022-04-02 DIAGNOSIS — Z79899 Other long term (current) drug therapy: Secondary | ICD-10-CM | POA: Diagnosis not present

## 2022-04-02 DIAGNOSIS — E1121 Type 2 diabetes mellitus with diabetic nephropathy: Secondary | ICD-10-CM | POA: Diagnosis not present

## 2022-04-02 DIAGNOSIS — E039 Hypothyroidism, unspecified: Secondary | ICD-10-CM | POA: Diagnosis not present

## 2022-04-02 DIAGNOSIS — I700 Atherosclerosis of aorta: Secondary | ICD-10-CM | POA: Diagnosis not present

## 2022-04-02 DIAGNOSIS — F17210 Nicotine dependence, cigarettes, uncomplicated: Secondary | ICD-10-CM | POA: Diagnosis not present

## 2022-04-02 DIAGNOSIS — J438 Other emphysema: Secondary | ICD-10-CM | POA: Diagnosis not present

## 2022-04-02 DIAGNOSIS — E441 Mild protein-calorie malnutrition: Secondary | ICD-10-CM | POA: Diagnosis not present

## 2022-04-02 DIAGNOSIS — E559 Vitamin D deficiency, unspecified: Secondary | ICD-10-CM | POA: Diagnosis not present

## 2022-04-02 DIAGNOSIS — E7800 Pure hypercholesterolemia, unspecified: Secondary | ICD-10-CM | POA: Diagnosis not present

## 2022-04-02 DIAGNOSIS — E113293 Type 2 diabetes mellitus with mild nonproliferative diabetic retinopathy without macular edema, bilateral: Secondary | ICD-10-CM | POA: Diagnosis not present

## 2022-04-05 ENCOUNTER — Other Ambulatory Visit: Payer: Self-pay | Admitting: Family Medicine

## 2022-04-05 DIAGNOSIS — E2839 Other primary ovarian failure: Secondary | ICD-10-CM

## 2022-04-07 DIAGNOSIS — F17210 Nicotine dependence, cigarettes, uncomplicated: Secondary | ICD-10-CM | POA: Diagnosis not present

## 2022-06-01 ENCOUNTER — Ambulatory Visit (INDEPENDENT_AMBULATORY_CARE_PROVIDER_SITE_OTHER): Payer: 59 | Admitting: Podiatry

## 2022-06-01 ENCOUNTER — Encounter: Payer: Self-pay | Admitting: Podiatry

## 2022-06-01 VITALS — BP 138/67

## 2022-06-01 DIAGNOSIS — Q828 Other specified congenital malformations of skin: Secondary | ICD-10-CM | POA: Diagnosis not present

## 2022-06-01 DIAGNOSIS — M79674 Pain in right toe(s): Secondary | ICD-10-CM | POA: Diagnosis not present

## 2022-06-01 DIAGNOSIS — B351 Tinea unguium: Secondary | ICD-10-CM

## 2022-06-01 DIAGNOSIS — M79675 Pain in left toe(s): Secondary | ICD-10-CM | POA: Diagnosis not present

## 2022-06-01 DIAGNOSIS — E119 Type 2 diabetes mellitus without complications: Secondary | ICD-10-CM | POA: Diagnosis not present

## 2022-06-05 NOTE — Progress Notes (Signed)
  Subjective:  Patient ID: [name] Day, female    DOB: [birth year],  MRN: [id number]  [name] Day presents to clinic today for preventative diabetic foot care and callus(es) bilateral heels, porokeratotic lesion(s) both feet, and painful mycotic nails. Painful toenails interfere with ambulation. Aggravating factors include wearing enclosed shoe gear. Pain is relieved with periodic professional debridement. Painful callus(es) and porokeratotic lesion(s) are aggravated when weightbearing with and without shoegear. Pain is relieved with periodic professional debridement.  Chief Complaint  Patient presents with   Nail Problem    Rm 16 Rfc bilateral nail trim.    New problem(s): None.   PCP is Mila Palmer, MD.  No Known Allergies  Review of Systems: Negative except as noted in the HPI.  Objective: No changes noted in today's physical examination. Vitals:   06/01/22 1359  BP: 138/67   [name] Day is a pleasant 72 y.o. female WD, WN in NAD. AAO x 3.  Vascular Examination: CFT <3 seconds b/l LE. Palpable pedal pulses b/l LE. Pedal hair absent. No pain with calf compression b/l. No edema noted b/l LE. No cyanosis or clubbing noted b/l LE.  Dermatological Examination: Pedal integument with normal turgor, texture and tone BLE. No open wounds b/l LE. No interdigital macerations noted b/l LE. Toenails 1-5 b/l elongated, discolored, dystrophic, thickened, crumbly with subungual debris and tenderness to dorsal palpation.   Hyperkeratotic lesion(s) resolved bilateral heels.  Porokeratotic lesion(s) submet head 5 b/l. No erythema, no edema, no drainage, no fluctuance.  Neurological Examination: Protective sensation intact 5/5 intact bilaterally with 10g monofilament b/l. Vibratory sensation intact b/l. Proprioception intact bilaterally.  Musculoskeletal Examination: Normal muscle strength 5/5 to all lower extremity muscle groups bilaterally. HAV with bunion bilaterally and  hammertoes 2-5 b/l.Marland Kitchen No pain, crepitus or joint limitation noted with ROM b/l LE.  Patient ambulates independently without assistive aids.  Assessment/Plan: 1. Pain due to onychomycosis of toenails of both feet   2. Porokeratosis   3. Diabetes mellitus type 2, diet-controlled (HCC)     -Consent given for treatment as described below: -Examined patient. -Continue foot and shoe inspections daily. Monitor blood glucose per PCP/Endocrinologist's recommendations. -Patient to continue soft, supportive shoe gear daily. -Toenails 1-5 b/l were debrided in length and girth with sterile nail nippers and dremel without iatrogenic bleeding.  -Porokeratotic lesion(s) submet head 5 b/l pared and enucleated with sterile currette without incident. Total number of lesions debrided=2. -Patient/POA to call should there be question/concern in the interim.   Return in about 3 months (around 08/31/2022).  [name], DPM

## 2022-07-06 DIAGNOSIS — H2513 Age-related nuclear cataract, bilateral: Secondary | ICD-10-CM | POA: Diagnosis not present

## 2022-07-06 DIAGNOSIS — H35033 Hypertensive retinopathy, bilateral: Secondary | ICD-10-CM | POA: Diagnosis not present

## 2022-07-06 DIAGNOSIS — H35341 Macular cyst, hole, or pseudohole, right eye: Secondary | ICD-10-CM | POA: Diagnosis not present

## 2022-09-06 ENCOUNTER — Ambulatory Visit: Payer: 59 | Admitting: Podiatry

## 2022-10-04 ENCOUNTER — Ambulatory Visit: Payer: 59 | Admitting: Podiatry

## 2022-10-04 DIAGNOSIS — B351 Tinea unguium: Secondary | ICD-10-CM | POA: Diagnosis not present

## 2022-10-04 DIAGNOSIS — L84 Corns and callosities: Secondary | ICD-10-CM | POA: Diagnosis not present

## 2022-10-04 DIAGNOSIS — E1151 Type 2 diabetes mellitus with diabetic peripheral angiopathy without gangrene: Secondary | ICD-10-CM

## 2022-10-04 NOTE — Progress Notes (Signed)
       Subjective:  Patient ID: [name] Day, female    DOB: 04/17/1950,  MRN: [id number]  [name] presents to clinic today for:  Chief Complaint  Patient presents with   Diabetes    [facility]   Callouses    BILAT LATERAL SIDE OF FOOT   Patient notes nails are thick and elongated, causing pain in shoe gear when ambulating.  Patient also notes painful calluses on the plantar aspect of both feet.  PCP is Mila Palmer, MD. date last seen was 05/28/2022  Past Medical History:  Diagnosis Date   Hyperlipidemia    Hypothyroidism    Schizoaffective disorder (HCC)    Tobacco abuse     No Known Allergies  Review of Systems: Negative except as noted in the HPI.  Objective:  There were no vitals filed for this visit.  [name] is a pleasant 72 y.o. female in NAD. AAO x 3.  Vascular Examination: Patient has palpable DP pulse, absent PT pulse bilateral.  Delayed capillary refill bilateral toes.  Sparse digital hair bilateral.  Proximal to distal cooling WNL bilateral.    Dermatological Examination: Interspaces are clear with no open lesions noted bilateral.  Skin is shiny and atrophic bilateral.  Nails are 3-66mm thick, with yellowish/brown discoloration, subungual debris and distal onycholysis x10.  There is pain with compression of nails x10.  There are hyperkeratotic lesions noted submet 5 bilateral and plantar aspect of both heels..  Patient qualifies for at-risk foot care because of diabetes with PVD.  Assessment/Plan: 1. Dermatophytosis of nail   2. Type II diabetes mellitus with peripheral circulatory disorder (HCC)   3. Callus of foot    Mycotic nails x10 were sharply debrided with sterile nail nippers and power debriding burr to decrease bulk and length.  Hyperkeratotic lesions x 4 were shaved with #312 blade.   Return in about 3 months (around 01/04/2023) for [facility].   Clerance Lav, DPM, FACFAS [facility]     [address] [place], [place] [address]                Office [phone]  Fax [phone]

## 2022-10-08 ENCOUNTER — Other Ambulatory Visit: Payer: Self-pay | Admitting: Family Medicine

## 2022-10-08 DIAGNOSIS — Z1231 Encounter for screening mammogram for malignant neoplasm of breast: Secondary | ICD-10-CM

## 2022-10-13 DIAGNOSIS — F17210 Nicotine dependence, cigarettes, uncomplicated: Secondary | ICD-10-CM | POA: Diagnosis not present

## 2022-10-13 DIAGNOSIS — Z23 Encounter for immunization: Secondary | ICD-10-CM | POA: Diagnosis not present

## 2022-10-13 DIAGNOSIS — E1121 Type 2 diabetes mellitus with diabetic nephropathy: Secondary | ICD-10-CM | POA: Diagnosis not present

## 2022-10-28 ENCOUNTER — Ambulatory Visit
Admission: RE | Admit: 2022-10-28 | Discharge: 2022-10-28 | Disposition: A | Discharge status: Home / Self Care | Payer: 59 | Source: Ambulatory Visit | Attending: Family Medicine | Admitting: Family Medicine

## 2022-10-28 DIAGNOSIS — F17200 Nicotine dependence, unspecified, uncomplicated: Secondary | ICD-10-CM

## 2022-10-28 DIAGNOSIS — F17210 Nicotine dependence, cigarettes, uncomplicated: Secondary | ICD-10-CM | POA: Diagnosis not present

## 2022-10-29 ENCOUNTER — Other Ambulatory Visit: Payer: Self-pay | Admitting: Family Medicine

## 2022-10-29 DIAGNOSIS — E2839 Other primary ovarian failure: Secondary | ICD-10-CM

## 2022-11-16 ENCOUNTER — Ambulatory Visit
Admission: RE | Admit: 2022-11-16 | Discharge: 2022-11-16 | Disposition: A | Discharge status: Home / Self Care | Payer: 59 | Source: Ambulatory Visit | Attending: Family Medicine | Admitting: Family Medicine

## 2022-11-16 DIAGNOSIS — Z1231 Encounter for screening mammogram for malignant neoplasm of breast: Secondary | ICD-10-CM | POA: Diagnosis not present

## 2023-01-10 ENCOUNTER — Ambulatory Visit: Payer: 59 | Admitting: Podiatry

## 2023-01-18 ENCOUNTER — Ambulatory Visit (INDEPENDENT_AMBULATORY_CARE_PROVIDER_SITE_OTHER): Payer: 59 | Admitting: Podiatry

## 2023-01-18 ENCOUNTER — Encounter: Payer: Self-pay | Admitting: Podiatry

## 2023-01-18 DIAGNOSIS — E1151 Type 2 diabetes mellitus with diabetic peripheral angiopathy without gangrene: Secondary | ICD-10-CM | POA: Diagnosis not present

## 2023-01-18 DIAGNOSIS — M79674 Pain in right toe(s): Secondary | ICD-10-CM | POA: Diagnosis not present

## 2023-01-18 DIAGNOSIS — B351 Tinea unguium: Secondary | ICD-10-CM | POA: Diagnosis not present

## 2023-01-18 DIAGNOSIS — L84 Corns and callosities: Secondary | ICD-10-CM | POA: Diagnosis not present

## 2023-01-18 DIAGNOSIS — M79675 Pain in left toe(s): Secondary | ICD-10-CM

## 2023-01-18 NOTE — Progress Notes (Signed)
       Subjective:  Patient ID: [name] Day, female    DOB: 08-15-1950,  MRN: [id number]  [name] Day presents to clinic today for:  Chief Complaint  Patient presents with   [facility]    [facility]  . Patient notes nails are thick and elongated, causing pain in shoe gear when ambulating.  Bilateral submet 5 and plantar heel calluses are painful as well.  PCP is Mila Palmer, MD.  Last seen 10/13/22.  Past Medical History:  Diagnosis Date   Hyperlipidemia    Hypothyroidism    Schizoaffective disorder (HCC)    Tobacco abuse    No Known Allergies  Objective:  [name] Day is a pleasant 72 y.o. female in NAD. AAO x 3.  Vascular Examination: Patient has palpable DP pulse, absent PT pulse bilateral.  Delayed capillary refill bilateral toes.  Sparse digital hair bilateral.  Proximal to distal cooling WNL bilateral.    Dermatological Examination: Interspaces are clear with no open lesions noted bilateral.  Skin is shiny and atrophic bilateral.  Nails are 3-104mm thick, with yellowish/brown discoloration, subungual debris and distal onycholysis x10.  There is pain with compression of nails x10.  There are hyperkeratotic lesions noted bilateral submet 5 and bilateral plantar heel .  Patient qualifies for at-risk foot care because of diabetes with PVD .  Assessment/Plan: 1. Pain due to onychomycosis of toenails of both feet   2. Callus of foot   3. Type II diabetes mellitus with peripheral circulatory disorder (HCC)    Mycotic nails x10 were sharply debrided with sterile nail nippers and power debriding burr to decrease bulk and length.  Hyperkeratotic lesions x4 were shaved with #312 blade.  Return in about 3 months (around 04/18/2023) for [facility].   Clerance Lav, DPM, FACFAS [facility]     [address] [place], [place] [address]                Office [phone]  Fax [phone]

## 2023-04-25 ENCOUNTER — Ambulatory Visit: Payer: 59 | Admitting: Podiatry

## 2023-05-03 ENCOUNTER — Ambulatory Visit (INDEPENDENT_AMBULATORY_CARE_PROVIDER_SITE_OTHER): Admitting: Podiatry

## 2023-05-03 DIAGNOSIS — L84 Corns and callosities: Secondary | ICD-10-CM | POA: Diagnosis not present

## 2023-05-03 DIAGNOSIS — M79675 Pain in left toe(s): Secondary | ICD-10-CM | POA: Diagnosis not present

## 2023-05-03 DIAGNOSIS — M79674 Pain in right toe(s): Secondary | ICD-10-CM

## 2023-05-03 DIAGNOSIS — B351 Tinea unguium: Secondary | ICD-10-CM

## 2023-05-03 DIAGNOSIS — E1151 Type 2 diabetes mellitus with diabetic peripheral angiopathy without gangrene: Secondary | ICD-10-CM

## 2023-05-03 NOTE — Progress Notes (Unsigned)
    Subjective:  Patient ID: [name] Day, female    DOB: 31-Dec-1950,  MRN: [id number]  [name] Day presents to clinic today for:  Chief Complaint  Patient presents with   [facility]    Last A1c: unsure. Per patient "normal". Takes clopidogrel. Needs nail care. She does have some calluses on her left foot.    Patient notes nails are thick and elongated, causing pain in shoe gear when ambulating.  She has painful plantar calluses.    PCP is Mila Palmer, MD.  Last seen 04/29/23  Past Medical History:  Diagnosis Date   Hyperlipidemia    Hypothyroidism    Schizoaffective disorder (HCC)    Tobacco abuse    No Known Allergies  Objective:  [name] Day is a pleasant 73 y.o. female in NAD. AAO x 3.  Vascular Examination: Patient has palpable DP pulse, absent PT pulse bilateral.  Delayed capillary refill bilateral toes.  Sparse digital hair bilateral.  Proximal to distal cooling WNL bilateral.    Dermatological Examination: Interspaces are clear with no open lesions noted bilateral.  Skin is shiny and atrophic bilateral.  Nails are 3-47mm thick, with yellowish/brown discoloration, subungual debris and distal onycholysis x10.  There is pain with compression of nails x10.  There are hyperkeratotic lesions noted bilateral submet 5 and heels .  Patient qualifies for at-risk foot care because of diabetes with PVD .  Assessment/Plan: 1. Pain due to onychomycosis of toenails of both feet   2. Callus of foot   3. Type II diabetes mellitus with peripheral circulatory disorder (HCC)    Mycotic nails x10 were sharply debrided with sterile nail nippers and power debriding burr to decrease bulk and length.  Hyperkeratotic lesions x4 were shaved with #312 blade.   Return in about 3 months (around 08/03/2023) for [facility].   Clerance Lav, DPM, FACFAS [facility]     [address] [place], [place] [address]                Office  [phone]  Fax [phone]

## 2023-05-13 ENCOUNTER — Other Ambulatory Visit: Payer: Self-pay | Admitting: Family Medicine

## 2023-05-13 ENCOUNTER — Other Ambulatory Visit: Payer: 59

## 2023-05-13 DIAGNOSIS — E2839 Other primary ovarian failure: Secondary | ICD-10-CM

## 2023-08-08 ENCOUNTER — Ambulatory Visit (INDEPENDENT_AMBULATORY_CARE_PROVIDER_SITE_OTHER): Admitting: Podiatry

## 2023-08-08 DIAGNOSIS — B351 Tinea unguium: Secondary | ICD-10-CM | POA: Diagnosis not present

## 2023-08-08 DIAGNOSIS — L84 Corns and callosities: Secondary | ICD-10-CM

## 2023-08-08 DIAGNOSIS — E1151 Type 2 diabetes mellitus with diabetic peripheral angiopathy without gangrene: Secondary | ICD-10-CM

## 2023-08-08 DIAGNOSIS — M79675 Pain in left toe(s): Secondary | ICD-10-CM | POA: Diagnosis not present

## 2023-08-08 DIAGNOSIS — M79674 Pain in right toe(s): Secondary | ICD-10-CM | POA: Diagnosis not present

## 2023-08-08 NOTE — Progress Notes (Signed)
    Subjective:  Patient ID: [name] Day, female    DOB: [birth year],  MRN: [id number]  [name] Day presents to clinic today for:  Chief Complaint  Patient presents with   Nail Problem    Nail trim    Patient notes nails are thick and elongated, causing pain in shoe gear when ambulating.  There are painful calluses bilateral submet 5 and bilateral heels.  PCP is Verena Mems, MD. last seen around 04/29/2023  Past Medical History:  Diagnosis Date   Hyperlipidemia    Hypothyroidism    Schizoaffective disorder (HCC)    Tobacco abuse    No Known Allergies  Objective:  [name] Day is a pleasant 73 y.o. female in NAD. AAO x 3.  Vascular Examination: Patient has palpable DP pulse, absent PT pulse bilateral.  Delayed capillary refill bilateral toes.  Sparse digital hair bilateral.  Proximal to distal cooling WNL bilateral.    Dermatological Examination: Interspaces are clear with no open lesions noted bilateral.  Skin is shiny and atrophic bilateral.  Nails are 3-56mm thick, with yellowish/brown discoloration, subungual debris and distal onycholysis x10.  There is pain with compression of nails x10.  There are hyperkeratotic lesions noted bilateral submet 5 and bilateral plantar heel.  Patient qualifies for at-risk foot care because of diabetes with PVD.  Assessment/Plan: 1. Pain due to onychomycosis of toenails of both feet   2. Callus of foot   3. Type II diabetes mellitus with peripheral circulatory disorder (HCC)     Mycotic nails x10 were sharply debrided with sterile nail nippers and power debriding burr to decrease bulk and length.  Hyperkeratotic lesions x 4 were shaved with #312 blade.  None of the lesions are on the toes.  Return in about 3 months (around 11/08/2023) for [facility].   Awanda CHARM [place], DPM, FACFAS [facility]     [address] [place], [place] [address]                Office [phone]  Fax [phone]

## 2023-10-14 ENCOUNTER — Other Ambulatory Visit: Payer: Self-pay | Admitting: Student

## 2023-10-14 DIAGNOSIS — Z1231 Encounter for screening mammogram for malignant neoplasm of breast: Secondary | ICD-10-CM

## 2023-11-07 ENCOUNTER — Other Ambulatory Visit: Payer: Self-pay | Admitting: Student

## 2023-11-07 ENCOUNTER — Encounter: Payer: Self-pay | Admitting: Student

## 2023-11-07 DIAGNOSIS — F17210 Nicotine dependence, cigarettes, uncomplicated: Secondary | ICD-10-CM

## 2023-11-07 DIAGNOSIS — J438 Other emphysema: Secondary | ICD-10-CM

## 2023-11-14 ENCOUNTER — Encounter: Payer: Self-pay | Admitting: Podiatry

## 2023-11-14 ENCOUNTER — Ambulatory Visit: Admitting: Podiatry

## 2023-11-14 DIAGNOSIS — I739 Peripheral vascular disease, unspecified: Secondary | ICD-10-CM

## 2023-11-14 DIAGNOSIS — B351 Tinea unguium: Secondary | ICD-10-CM | POA: Diagnosis not present

## 2023-11-14 DIAGNOSIS — L84 Corns and callosities: Secondary | ICD-10-CM

## 2023-11-14 DIAGNOSIS — M79675 Pain in left toe(s): Secondary | ICD-10-CM | POA: Diagnosis not present

## 2023-11-14 DIAGNOSIS — M79674 Pain in right toe(s): Secondary | ICD-10-CM

## 2023-11-14 NOTE — Progress Notes (Signed)
    Subjective:  Patient ID: [name] Day, female    DOB: Jun 19, 1950,  MRN: [id number]  [name] Day presents to clinic today for:  Chief Complaint  Patient presents with   [facility]     RFC Non diabetic toenail trim and callus care.   Patient notes nails are thick and elongated, causing pain in shoe gear when ambulating.    She is prediabetic.  Has painful corns & calluses.   PCP is [name] Stabs, NP.  Past Medical History:  Diagnosis Date   Hyperlipidemia    Hypothyroidism    Schizoaffective disorder (HCC)    Tobacco abuse    No Known Allergies  Objective:  [name] Day is a pleasant 73 y.o. female in NAD. AAO x 3.  Vascular Examination: Patient has palpable DP pulse, absent PT pulse bilateral.  Delayed capillary refill bilateral toes.  Sparse digital hair bilateral.  Proximal to distal cooling WNL bilateral.    Dermatological Examination: Interspaces are clear with no open lesions noted bilateral.  Skin is shiny and atrophic bilateral.  Nails are 3-13mm thick, with yellowish/brown discoloration, subungual debris and distal onycholysis x10.  There is pain with compression of nails x10.  There are hyperkeratotic lesions noted bilateral submet 5 and bilateral plantar heels.  Patient qualifies for at-risk foot care because of PVD .  Assessment/Plan: 1. Pain due to onychomycosis of toenails of both feet   2. Callus of foot   3. PVD (peripheral vascular disease)     Mycotic nails x10 were sharply debrided with sterile nail nippers and power debriding burr to decrease bulk and length.  Medicaid ABN signed for callus care today as this is her secondary insurance.  Following the patient's review and signature of this form, the hyperkeratotic lesions x 4 (bilateral submet 5 and bilateral heels) were shaved with #312 blade.  Return in about 3 months (around 02/14/2024) for RFC.   [name] Day, DPM, FACFAS [facility]     [address] [place], [place] [address]                Office [phone]  Fax [phone]

## 2023-11-15 ENCOUNTER — Ambulatory Visit
Admission: RE | Admit: 2023-11-15 | Discharge: 2023-11-15 | Disposition: A | Discharge status: Home / Self Care | Source: Ambulatory Visit | Attending: Student | Admitting: Student

## 2023-11-15 ENCOUNTER — Telehealth: Payer: Self-pay | Admitting: Podiatry

## 2023-11-15 DIAGNOSIS — F17210 Nicotine dependence, cigarettes, uncomplicated: Secondary | ICD-10-CM

## 2023-11-15 DIAGNOSIS — J438 Other emphysema: Secondary | ICD-10-CM

## 2023-11-15 NOTE — Telephone Encounter (Signed)
 Called to verify PCP. No one answered

## 2023-11-17 ENCOUNTER — Ambulatory Visit
Admission: RE | Admit: 2023-11-17 | Discharge: 2023-11-17 | Disposition: A | Discharge status: Home / Self Care | Source: Ambulatory Visit | Attending: Family Medicine | Admitting: Family Medicine

## 2023-11-17 ENCOUNTER — Telehealth: Payer: Self-pay | Admitting: Podiatry

## 2023-11-17 DIAGNOSIS — Z1231 Encounter for screening mammogram for malignant neoplasm of breast: Secondary | ICD-10-CM

## 2023-11-17 NOTE — Telephone Encounter (Signed)
 Called and spoke to patient. PCP is now updated in chart.

## 2024-01-20 ENCOUNTER — Other Ambulatory Visit

## 2024-01-31 ENCOUNTER — Other Ambulatory Visit (HOSPITAL_BASED_OUTPATIENT_CLINIC_OR_DEPARTMENT_OTHER)

## 2024-02-14 ENCOUNTER — Ambulatory Visit: Admitting: Podiatry

## 2024-02-17 ENCOUNTER — Ambulatory Visit: Admitting: Podiatry

## 2024-02-27 ENCOUNTER — Ambulatory Visit: Admitting: Podiatry

## 2024-03-29 ENCOUNTER — Ambulatory Visit: Admitting: Podiatry

## 2024-04-10 ENCOUNTER — Other Ambulatory Visit (HOSPITAL_BASED_OUTPATIENT_CLINIC_OR_DEPARTMENT_OTHER)
